# Patient Record
Sex: Male | Born: 1950
Health system: Southern US, Community
[De-identification: ages and names within clinical notes are randomized; demographics above are authoritative.]

## PROBLEM LIST (undated history)

## (undated) DIAGNOSIS — C801 Malignant (primary) neoplasm, unspecified: Secondary | ICD-10-CM

## (undated) DIAGNOSIS — F329 Major depressive disorder, single episode, unspecified: Secondary | ICD-10-CM

## (undated) DIAGNOSIS — Z8546 Personal history of malignant neoplasm of prostate: Secondary | ICD-10-CM

## (undated) DIAGNOSIS — F419 Anxiety disorder, unspecified: Secondary | ICD-10-CM

## (undated) DIAGNOSIS — K219 Gastro-esophageal reflux disease without esophagitis: Secondary | ICD-10-CM

## (undated) DIAGNOSIS — F32A Depression, unspecified: Secondary | ICD-10-CM

## (undated) DIAGNOSIS — E079 Disorder of thyroid, unspecified: Secondary | ICD-10-CM

## (undated) DIAGNOSIS — K2289 Other specified disease of esophagus: Secondary | ICD-10-CM

## (undated) DIAGNOSIS — K648 Other hemorrhoids: Secondary | ICD-10-CM

## (undated) DIAGNOSIS — K222 Esophageal obstruction: Secondary | ICD-10-CM

## (undated) DIAGNOSIS — K228 Other specified diseases of esophagus: Secondary | ICD-10-CM

## (undated) DIAGNOSIS — J189 Pneumonia, unspecified organism: Secondary | ICD-10-CM

## (undated) HISTORY — DX: Malignant (primary) neoplasm, unspecified: C80.1

## (undated) HISTORY — DX: Other specified disease of esophagus: K22.89

## (undated) HISTORY — DX: Other hemorrhoids: K64.8

## (undated) HISTORY — DX: Gastro-esophageal reflux disease without esophagitis: K21.9

## (undated) HISTORY — DX: Esophageal obstruction: K22.2

## (undated) HISTORY — DX: Anxiety disorder, unspecified: F41.9

## (undated) HISTORY — DX: Personal history of malignant neoplasm of prostate: Z85.46

## (undated) HISTORY — DX: Pneumonia, unspecified organism: J18.9

## (undated) HISTORY — PX: TONSILLECTOMY: SUR1361

## (undated) HISTORY — DX: Disorder of thyroid, unspecified: E07.9

## (undated) HISTORY — DX: Other specified diseases of esophagus: K22.8

## (undated) HISTORY — DX: Depression, unspecified: F32.A

---
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
## 2005-01-06 HISTORY — PX: REFRACTIVE SURGERY: SHX103

## 2008-03-23 ENCOUNTER — Encounter (INDEPENDENT_AMBULATORY_CARE_PROVIDER_SITE_OTHER): Payer: Self-pay | Admitting: *Deleted

## 2008-04-06 HISTORY — PX: COLONOSCOPY: SHX174

## 2008-04-20 ENCOUNTER — Ambulatory Visit: Payer: Self-pay | Admitting: Internal Medicine

## 2008-04-21 ENCOUNTER — Ambulatory Visit: Payer: Self-pay | Admitting: Internal Medicine

## 2008-04-29 LAB — CONVERTED CEMR LAB
Albumin: 4 g/dL (ref 3.5–5.2)
Alkaline Phosphatase: 47 units/L (ref 39–117)
Basophils Absolute: 0 10*3/uL (ref 0.0–0.1)
Bilirubin, Direct: 0.2 mg/dL (ref 0.0–0.3)
Cholesterol: 188 mg/dL (ref 0–200)
Eosinophils Absolute: 0.1 10*3/uL (ref 0.0–0.7)
Eosinophils Relative: 2.6 % (ref 0.0–5.0)
GFR calc non Af Amer: 66.12 mL/min (ref >60)
HCT: 45.7 % (ref 39.0–52.0)
HDL: 50.5 mg/dL (ref >39.00)
MCHC: 34 g/dL (ref 30.0–36.0)
MCV: 92.8 fL (ref 78.0–100.0)
Monocytes Absolute: 0.7 10*3/uL (ref 0.1–1.0)
Monocytes Relative: 12.8 % — ABNORMAL HIGH (ref 3.0–12.0)
Neutro Abs: 3.1 10*3/uL (ref 1.4–7.7)
Neutrophils Relative %: 55.2 % (ref 43.0–77.0)
PSA: 11.94 ng/mL — ABNORMAL HIGH (ref 0.10–4.00)
Platelets: 176 10*3/uL (ref 150.0–400.0)
Potassium: 4.8 meq/L (ref 3.5–5.1)
Triglycerides: 68 mg/dL (ref 0.0–149.0)
VLDL: 13.6 mg/dL (ref 0.0–40.0)

## 2008-05-01 ENCOUNTER — Encounter (INDEPENDENT_AMBULATORY_CARE_PROVIDER_SITE_OTHER): Payer: Self-pay | Admitting: *Deleted

## 2008-05-02 ENCOUNTER — Telehealth (INDEPENDENT_AMBULATORY_CARE_PROVIDER_SITE_OTHER): Payer: Self-pay | Admitting: *Deleted

## 2008-05-10 ENCOUNTER — Ambulatory Visit: Payer: Self-pay | Admitting: Gastroenterology

## 2008-05-24 ENCOUNTER — Ambulatory Visit: Payer: Self-pay | Admitting: Gastroenterology

## 2008-05-25 ENCOUNTER — Ambulatory Visit: Payer: Self-pay | Admitting: Internal Medicine

## 2008-05-25 ENCOUNTER — Telehealth: Payer: Self-pay | Admitting: Internal Medicine

## 2008-05-25 LAB — CONVERTED CEMR LAB: PSA: 12.69 ng/mL — ABNORMAL HIGH (ref 0.10–4.00)

## 2008-05-26 ENCOUNTER — Encounter (INDEPENDENT_AMBULATORY_CARE_PROVIDER_SITE_OTHER): Payer: Self-pay | Admitting: *Deleted

## 2008-06-23 ENCOUNTER — Encounter: Payer: Self-pay | Admitting: Internal Medicine

## 2008-06-29 ENCOUNTER — Encounter: Payer: Self-pay | Admitting: Internal Medicine

## 2008-08-15 ENCOUNTER — Encounter: Payer: Self-pay | Admitting: Internal Medicine

## 2008-08-21 ENCOUNTER — Ambulatory Visit: Admission: RE | Admit: 2008-08-21 | Discharge: 2008-09-20 | Payer: Self-pay | Admitting: Radiation Oncology

## 2008-08-22 ENCOUNTER — Encounter: Payer: Self-pay | Admitting: Internal Medicine

## 2008-11-07 ENCOUNTER — Encounter: Payer: Self-pay | Admitting: Internal Medicine

## 2008-11-20 ENCOUNTER — Encounter (INDEPENDENT_AMBULATORY_CARE_PROVIDER_SITE_OTHER): Payer: Self-pay | Admitting: Urology

## 2008-11-20 ENCOUNTER — Inpatient Hospital Stay (HOSPITAL_COMMUNITY): Admission: RE | Admit: 2008-11-20 | Discharge: 2008-11-21 | Payer: Self-pay | Admitting: Urology

## 2008-11-20 DIAGNOSIS — Z8546 Personal history of malignant neoplasm of prostate: Secondary | ICD-10-CM

## 2008-11-20 HISTORY — PX: PROSTATECTOMY: SHX69

## 2008-11-20 HISTORY — DX: Personal history of malignant neoplasm of prostate: Z85.46

## 2008-11-27 ENCOUNTER — Encounter: Payer: Self-pay | Admitting: Internal Medicine

## 2009-01-06 DIAGNOSIS — J189 Pneumonia, unspecified organism: Secondary | ICD-10-CM

## 2009-01-06 HISTORY — DX: Pneumonia, unspecified organism: J18.9

## 2009-01-11 ENCOUNTER — Telehealth: Payer: Self-pay | Admitting: Internal Medicine

## 2009-01-11 ENCOUNTER — Ambulatory Visit: Payer: Self-pay | Admitting: Internal Medicine

## 2009-01-11 DIAGNOSIS — R091 Pleurisy: Secondary | ICD-10-CM | POA: Insufficient documentation

## 2009-01-11 DIAGNOSIS — R0602 Shortness of breath: Secondary | ICD-10-CM

## 2009-01-11 DIAGNOSIS — J189 Pneumonia, unspecified organism: Secondary | ICD-10-CM | POA: Insufficient documentation

## 2009-01-11 DIAGNOSIS — Z8546 Personal history of malignant neoplasm of prostate: Secondary | ICD-10-CM | POA: Insufficient documentation

## 2009-01-11 DIAGNOSIS — R0609 Other forms of dyspnea: Secondary | ICD-10-CM | POA: Insufficient documentation

## 2009-01-11 DIAGNOSIS — R0600 Dyspnea, unspecified: Secondary | ICD-10-CM | POA: Insufficient documentation

## 2009-01-12 ENCOUNTER — Telehealth: Payer: Self-pay | Admitting: Internal Medicine

## 2009-01-22 ENCOUNTER — Ambulatory Visit: Payer: Self-pay | Admitting: Internal Medicine

## 2009-01-22 DIAGNOSIS — F419 Anxiety disorder, unspecified: Secondary | ICD-10-CM | POA: Insufficient documentation

## 2009-01-22 LAB — CONVERTED CEMR LAB
Bilirubin Urine: NEGATIVE
Glucose, Urine, Semiquant: NEGATIVE
Ketones, urine, test strip: NEGATIVE
Protein, U semiquant: NEGATIVE
Urobilinogen, UA: 0.2
pH: 6

## 2009-01-23 ENCOUNTER — Telehealth (INDEPENDENT_AMBULATORY_CARE_PROVIDER_SITE_OTHER): Payer: Self-pay | Admitting: *Deleted

## 2009-01-23 ENCOUNTER — Encounter (INDEPENDENT_AMBULATORY_CARE_PROVIDER_SITE_OTHER): Payer: Self-pay | Admitting: *Deleted

## 2009-01-23 ENCOUNTER — Encounter: Payer: Self-pay | Admitting: Internal Medicine

## 2009-01-23 LAB — CONVERTED CEMR LAB
Basophils Absolute: 0.1 10*3/uL (ref 0.0–0.1)
Basophils Relative: 0.7 % (ref 0.0–3.0)
Eosinophils Absolute: 0.7 10*3/uL (ref 0.0–0.7)
Lymphocytes Relative: 17.8 % (ref 12.0–46.0)
MCHC: 32.4 g/dL (ref 30.0–36.0)
Monocytes Relative: 9.9 % (ref 3.0–12.0)
Neutrophils Relative %: 65.4 % (ref 43.0–77.0)
RBC: 4.56 M/uL (ref 4.22–5.81)

## 2009-01-24 ENCOUNTER — Encounter: Payer: Self-pay | Admitting: Internal Medicine

## 2009-01-30 ENCOUNTER — Ambulatory Visit: Payer: Self-pay | Admitting: Internal Medicine

## 2009-01-31 ENCOUNTER — Telehealth: Payer: Self-pay | Admitting: Internal Medicine

## 2009-01-31 DIAGNOSIS — J90 Pleural effusion, not elsewhere classified: Secondary | ICD-10-CM | POA: Insufficient documentation

## 2009-02-02 ENCOUNTER — Encounter: Payer: Self-pay | Admitting: Internal Medicine

## 2009-02-02 ENCOUNTER — Ambulatory Visit (HOSPITAL_COMMUNITY): Admission: RE | Admit: 2009-02-02 | Discharge: 2009-02-02 | Payer: Self-pay | Admitting: Internal Medicine

## 2009-02-09 ENCOUNTER — Encounter: Payer: Self-pay | Admitting: Internal Medicine

## 2009-02-16 ENCOUNTER — Telehealth (INDEPENDENT_AMBULATORY_CARE_PROVIDER_SITE_OTHER): Payer: Self-pay | Admitting: *Deleted

## 2009-02-20 ENCOUNTER — Ambulatory Visit: Payer: Self-pay | Admitting: Internal Medicine

## 2009-03-23 ENCOUNTER — Telehealth (INDEPENDENT_AMBULATORY_CARE_PROVIDER_SITE_OTHER): Payer: Self-pay | Admitting: *Deleted

## 2009-05-01 ENCOUNTER — Telehealth (INDEPENDENT_AMBULATORY_CARE_PROVIDER_SITE_OTHER): Payer: Self-pay | Admitting: *Deleted

## 2009-05-30 ENCOUNTER — Encounter: Payer: Self-pay | Admitting: Internal Medicine

## 2009-05-31 ENCOUNTER — Telehealth (INDEPENDENT_AMBULATORY_CARE_PROVIDER_SITE_OTHER): Payer: Self-pay | Admitting: *Deleted

## 2009-06-28 ENCOUNTER — Encounter: Payer: Self-pay | Admitting: Internal Medicine

## 2009-06-28 ENCOUNTER — Telehealth (INDEPENDENT_AMBULATORY_CARE_PROVIDER_SITE_OTHER): Payer: Self-pay | Admitting: *Deleted

## 2009-07-20 ENCOUNTER — Telehealth (INDEPENDENT_AMBULATORY_CARE_PROVIDER_SITE_OTHER): Payer: Self-pay | Admitting: *Deleted

## 2009-08-27 ENCOUNTER — Telehealth (INDEPENDENT_AMBULATORY_CARE_PROVIDER_SITE_OTHER): Payer: Self-pay | Admitting: *Deleted

## 2009-10-26 ENCOUNTER — Telehealth (INDEPENDENT_AMBULATORY_CARE_PROVIDER_SITE_OTHER): Payer: Self-pay | Admitting: *Deleted

## 2009-12-05 ENCOUNTER — Encounter: Payer: Self-pay | Admitting: Internal Medicine

## 2009-12-25 ENCOUNTER — Telehealth (INDEPENDENT_AMBULATORY_CARE_PROVIDER_SITE_OTHER): Payer: Self-pay | Admitting: *Deleted

## 2010-01-17 ENCOUNTER — Encounter: Payer: Self-pay | Admitting: Internal Medicine

## 2010-02-05 NOTE — Progress Notes (Signed)
Summary: Lab/Chest Xray Results  Phone Note Outgoing Call Call back at [facility] Phone [phone]   Call placed by: Shonna Chock,  January 23, 2009 12:11 PM Call placed to: Patient Summary of Call: Spoke with patient to discuss labs and chest xray: White blood count still  mildly increased; recommend Avelox 400 mg once daily #5  R lower lobe poorly inflated . Push Incentive Spirometry X4 hourly while awake . Repeat CXray in 1 week.  Patient ok'd all information. Samples of Avelox placed at the front along with labs and order to recheck chest xray in 1 week./Chrae [facility]  January 23, 2009 4:34 PM

## 2010-02-05 NOTE — Letter (Signed)
Summary: [facility]  [facility]   Imported By: Lanelle Bal 02/02/2009 08:12:58  _____________________________________________________________________  External Attachment:    Type:   Image     Comment:   External Document

## 2010-02-05 NOTE — Assessment & Plan Note (Signed)
Summary: PAIN W/BREATHING,? PNEUMONIA/RH.....   Vital Signs:  Patient profile:   60 year old male Height:      74 inches Weight:      198 pounds O2 Sat:      99 % on Room air Temp:     98.8 degrees F oral Pulse rate:   64 / minute BP sitting:   120 / 78  (left arm)  Vitals Entered By: Jeremy Johann CMA (January 11, 2009 12:42 PM)  O2 Flow:  Room air CC: difficulty breathing, sob x1day Comments REVIEWED MED LIST, PATIENT AGREED DOSE AND INSTRUCTION CORRECT    CC:  difficulty breathing and sob x1day.  History of Present Illness: Acute pain  RUQ , with pressure upper chest &  sharp pain up to a 7 @ the R scapula limiting inspiration as of 01/10/2009. RTI last  weeks ago as  "viral ST, sinus headache . No PMH of smoking , pulmonary disease or clotting disorder. ZO:XWRU, Advil with benefit . PMH of prostate CA , S/P Robotic surgery.  Allergies (verified): No Known Drug Allergies  Past History:  Past Medical History: Keratonus , Dr Ether Griffins, [facility] Prostate cancer, hx of, Dr Laverle Patter  Past Surgical History: Laser surgery for detached retina 2007 Tonsillectomy Prostatectomy, Robotic , Dr Laverle Patter, 11/20/2008  Review of Systems General:  Complains of sweats; denies chills and fever. ENT:  Denies earache, nasal congestion, and sinus pressure; Frontal headache ; no facial pain; no purulence. Resp:  Complains of cough, pleuritic, shortness of breath, and sputum productive; denies coughing up blood and wheezing; Scant clear phlegm.  Physical Exam  General:  well-nourished,in no acute distress; alert,appropriate and cooperative throughout examination Ears:  External ear exam shows no significant lesions or deformities.  Otoscopic examination reveals clear canals, tympanic membranes are intact bilaterally without bulging, retraction, inflammation or discharge. Hearing is grossly normal bilaterally. Nose:  External nasal examination shows no deformity or inflammation. Nasal mucosa are  pink and moist without lesions or exudates. Mouth:  Oral mucosa and oropharynx without lesions or exudates.  Teeth in good repair. Lungs:  Normal respiratory effort, chest expands symmetrically. Lungs : mild crackles  @ R base Heart:  Normal rate and regular rhythm. S1 and S2 normal without gallop, murmur, click, rub or other extra sounds. Extremities:  No clubbing, cyanosis, edema. Neg Homan's  Skin:  Intact without suspicious lesions or rashes Cervical Nodes:  No lymphadenopathy noted Axillary Nodes:  No palpable lymphadenopathy Psych:  memory intact for recent and remote, normally interactive, and good eye contact.     Impression & Recommendations:  Problem # 1:  PNEUMONIA, RIGHT LOWER LOBE (ICD-486)  The following medications were removed from the medication list:    Ciprofloxacin Hcl 500 Mg Tabs (Ciprofloxacin hcl) .[name]... 1 two times a day His updated medication list for this problem includes:    Cefuroxime Axetil 500 Mg Tabs (Cefuroxime axetil) .[name]... 1 two times a day    Azithromycin 250 Mg Tabs (Azithromycin) .[name]... As per pack  Problem # 2:  PLEURISY (ICD-511.0) Clinically PTE NOT present  Problem # 3:  PROSTATE CANCER, HX OF (ICD-V10.46)  Complete Medication List: 1)  Cefuroxime Axetil 500 Mg Tabs (Cefuroxime axetil) .[name].. 1 two times a day 2)  Azithromycin 250 Mg Tabs (Azithromycin) .... As per pack 3)  Tramadol Hcl 50 Mg Tabs (Tramadol hcl) .[name].. 1 q 4-6 hrs as needed  pain  Patient Instructions: 1)  Blow 10 balloons daily. 2)  Take 400-600mg  of Ibuprofen (  Advil, Motrin) with food every 4-6 hours as needed for relief of pain or comfort of fever. Report Warning Signs : increasing pain, fever, SOB, & hemoptysis. Prescriptions: TRAMADOL HCL 50 MG TABS (TRAMADOL HCL) 1 q 4-6 hrs as needed  pain  #30 x 1   Entered and Authorized by:   Marga Melnick MD   Signed by:   Marga Melnick MD on 01/11/2009   Method used:   Faxed to ...       [facility] Kitchen (retail)        [address]       [place], [place]  [address]       Ph: [phone]       Fax: [phone]   RxID:   [id number] AZITHROMYCIN 250 MG TABS (AZITHROMYCIN) as per pack  #1 x 0   Entered and Authorized by:   Marga Melnick MD   Signed by:   Marga Melnick MD on 01/11/2009   Method used:   Faxed to ...       [facility] Kitchen (retail)       [address]       [place], [place]  [address]       Ph: [phone]       Fax: [phone]   RxID:   [id number] CEFUROXIME AXETIL 500 MG TABS (CEFUROXIME AXETIL) 1 two times a day  #20 x 0   Entered and Authorized by:   Marga Melnick MD   Signed by:   Marga Melnick MD on 01/11/2009   Method used:   Faxed to ...       [facility] Kitchen (retail)       [address]       [place], [place]  [address]       Ph: [phone]       Fax: [phone]   RxID:   [id number]

## 2010-02-05 NOTE — Letter (Signed)
Summary: [facility]  [facility]   Imported By: Lanelle Bal 06/14/2009 13:28:12  _____________________________________________________________________  External Attachment:    Type:   Image     Comment:   External Document

## 2010-02-05 NOTE — Progress Notes (Signed)
Summary: Refill Request  Phone Note Refill Request Call back at Home Phone [phone] Message from:  Patient  Refills Requested: Medication #1:  LORAZEPAM 0.5 MG TABS 1 q 8 hrs as needed. [facility]   Method Requested: Fax to Local Pharmacy Initial call taken by: Shonna Chock,  February 16, 2009 5:09 PM    Prescriptions: LORAZEPAM 0.5 MG TABS (LORAZEPAM) 1 q 8 hrs as needed  #30 x 0   Entered by:   Shonna Chock   Authorized by:   Marga Melnick MD   Signed by:   Shonna Chock on 02/16/2009   Method used:   Printed then faxed to ...       [facility] Kitchen (retail)       [address]       [place], [place]  [address]       Ph: [phone]       Fax: [phone]   RxID:   [id number]

## 2010-02-05 NOTE — Progress Notes (Signed)
Summary: refill   Phone Note Refill Request Call back at [phone] Message from:  Pharmacy on October 26, 2009 9:17 AM  Refills Requested: Medication #1:  LORAZEPAM 0.5 MG TABS 1 q 8 hrs as needed   Dosage confirmed as above?Dosage Confirmed   Supply Requested: 1 month   Last Ref[facility]: [facility]  Next Appointment Scheduled: none Initial call taken by: Lavell Islam,  October 26, 2009 9:18 AM    Prescriptions: LORAZEPAM 0.5 MG TABS (LORAZEPAM) 1 q 8 hrs as needed  #30 x 1   Entered by:   Shonna Chock CMA   Authorized by:   Marga Melnick MD   Signed by:   Shonna Chock CMA on 10/26/2009   Method used:   Printed then faxed to ...       [facility]land Kitchen (retail)       [address]       [place], [place]  [address]       Ph: [phone]       Fax: [phone]   RxID:   [id number]

## 2010-02-05 NOTE — Letter (Signed)
Summary: Results Follow up Letter  [facility] [place]  [address] [place], [place] [address]   Phone: [phone]  Fax: [phone]    01/23/2009 MRN: [id number]  [facility] [address] [place], [place]  [address]  Dear Caleb Jones,  The following are the results of your recent test(s):  Test         Result    Pap Smear:        Normal _____  Not Normal _____ Comments: ______________________________________________________ Cholesterol: LDL(Bad cholesterol):         Your goal is less than:         HDL (Good cholesterol):       Your goal is more than: Comments:  ______________________________________________________ Mammogram:        Normal _____  Not Normal _____ Comments:  ___________________________________________________________________ Hemoccult:        Normal _____  Not normal _______ Comments:    _____________________________________________________________________ Other Tests: Please see attached labs and chest xray done on 01/22/2009    We routinely do not discuss normal results over the telephone.  If you desire a copy of the results, or you have any questions about this information we can discuss them at your next office visit.   Sincerely,

## 2010-02-05 NOTE — Progress Notes (Signed)
Summary: Caleb Jones  Phone Note Call from Patient Call back at [phone]   Caller: Patient Call For: Marga Melnick MD Reason for Call: Talk to Nurse Complaint: Breathing Problems Summary of Call: PATIENT CALLING, C/O OF TROUBLE TAKING DEEP BREATHS ASSOCIATED WITH PAIN IN UPPER BACK.  PATIENT HAS AN APPT TO SEE DR. HOPPER TODAY, 12:15PM.   Initial call taken by: Magdalen Spatz [facility],  January 11, 2009 8:34 AM  Follow-up for Phone Call        pt c/o difficulty taking deep breath, sharpe pain right upper back, fatigue, sinus headache, and sore throat. pt denies any fever, cough, chest pain,numbness or tingling and dizziness. pt has pending OV advise UC if changes............Marland KitchenFelecia Deloach CMA  January 11, 2009 8:56 AM    Additional Follow-up for Phone Call Additional follow up Details #1::        Cxray @ [facility] pre appt @ 12:51 (511.0) Additional Follow-up by: Marga Melnick MD,  January 11, 2009 10:15 AM  New Problems: DYSPNEA [phone])   Additional Follow-up for Phone Call Additional follow up Details #2::    pt aware..............Marland KitchenFelecia Deloach CMA  January 11, 2009 10:30 AM   New Problems: DYSPNEA (ICD-786.05)

## 2010-02-05 NOTE — Progress Notes (Signed)
Summary: refill  Phone Note Refill Request Message from:  Fax from Pharmacy on May 31, 2009 9:59 AM  Refills Requested: Medication #1:  LORAZEPAM 0.5 MG TABS 1 q 8 hrs as [facility] fax [phone]  ph same   Method Requested: Fax to Local Pharmacy Initial call taken by: Okey Regal [place],  May 31, 2009 10:04 AM    Prescriptions: LORAZEPAM 0.5 MG TABS (LORAZEPAM) 1 q 8 hrs as needed  #30 x 0   Entered by:   Shonna Chock   Authorized by:   Marga Melnick MD   Signed by:   Shonna Chock on 05/31/2009   Method used:   Printed then faxed to ...       [facility] Kitchen (retail)       [address]       [place], [place]  [address]       Ph: [phone]       Fax: [phone]   RxID:   [id number]

## 2010-02-05 NOTE — Progress Notes (Signed)
Summary: refill  Phone Note Refill Request Message from:  Fax from Pharmacy on July 20, 2009 3:14 PM  Refills Requested: Medication #1:  LORAZEPAM 0.5 MG TABS 1 q 8 hrs as needed [facility] - fax [phone] --- phone [phone]  Initial call taken by: Okey Regal [place],  July 20, 2009 3:15 PM  Follow-up for Phone Call        OK X 1 Follow-up by: Marga Melnick MD,  July 20, 2009 4:04 PM    Prescriptions: LORAZEPAM 0.5 MG TABS (LORAZEPAM) 1 q 8 hrs as needed  #30 x 0   Entered by:   Shonna Chock CMA   Authorized by:   Marga Melnick MD   Signed by:   Shonna Chock CMA on 07/20/2009   Method used:   Printed then faxed to ...       [facility] Kitchen (retail)       [address]       [place], [place]  [address]       Ph: [phone]       Fax: [phone]   RxID:   [id number]

## 2010-02-05 NOTE — Progress Notes (Signed)
Summary: Xray results  Phone Note Outgoing Call Call back at [facility] Phone [phone] Call back at [phone]   Call placed by: Shonna Chock,  January 31, 2009 4:55 PM Call placed to: Patient Summary of Call: Dr.Aashish Hamm spoke with patient  Pleural effusion persists . Xray guided thoracentesis ( drawing off of fluid) needed for studies. ( Gram stain, cell count, LDH, protein, glucose,routine C&S, AFB smear & culture, Fungal smear & culture, Cytology)  Hopp please place order   Chrae [facility]  January 31, 2009 4:56 PM     Follow-up for Phone Call        DR. Taylah Dubiel, RADIOLOGY NEEDS TO KNOW HOW MUCH FLUID YOU WANT DRAWN OFF PLEASE.  CHRAE, PLEASE RETURN PT'S CALL ON HIS CELL, [phone]. Magdalen Spatz [facility]  February 01, 2009 8:37 AM   Additional Follow-up for Phone Call Additional follow up Details #1::        PATIENT CALLING FOR 2ND TIME TODAY, ANXIOUS TO GET PROCEDURE SCHEDULED.  DR. Alyscia Carmon, WILL PT NEED ALBUMIN TREATMENT, AND HOW MUCH FLUID DO YOU WANT DRAWN?  Magdalen Spatz [facility]  February 01, 2009 4:34 PM   New Problems: PLEURAL EFFUSION (ICD-511.9)   Additional Follow-up for Phone Call Additional follow up Details #2::    Per Dr. Alwyn Ren, pt will not need Albumin treatment, and draw up to 1 liter of fluid.  Patient is scheduled at [facility] on 02-02-2009 at 2pm.  Patient wanted this done asap, and [facility] only does 2x per week, and are closed on Fridays.  I spoke with patient he is aware of all above. Follow-up by: Magdalen Spatz [facility],  February 01, 2009 5:06 PM  New Problems: PLEURAL EFFUSION (ICD-511.9)

## 2010-02-05 NOTE — Miscellaneous (Signed)
Summary: Orders Update  Clinical Lists Changes  Orders: Added new Test order of T-2 View CXR (71020TC) - Signed 

## 2010-02-05 NOTE — Progress Notes (Signed)
Summary: Refill Request  Phone Note Refill Request Call back at [phone] Message from:  Pharmacy on August 27, 2009 1:44 PM  Refills Requested: Medication #1:  LORAZEPAM 0.5 MG TABS 1 q 8 hrs as needed   Dosage confirmed as above?Dosage Confirmed   Supply Requested: 1 month   Last Ref[facility]: [facility] [address]   Next Appointment Scheduled: none Initial call taken by: Harold Barban,  August 27, 2009 1:44 PM    Prescriptions: LORAZEPAM 0.5 MG TABS (LORAZEPAM) 1 q 8 hrs as needed  #30 x 1   Entered by:   Shonna Chock CMA   Authorized by:   Marga Melnick MD   Signed by:   Shonna Chock CMA on 08/27/2009   Method used:   Printed then faxed to ...       [facility] Kitchen (retail)       [address]       [place], [place]  [address]       Ph: [phone]       Fax: [phone]   RxID:   [id number]

## 2010-02-05 NOTE — Progress Notes (Signed)
Summary: Refill Request/? interactions   Phone Note Refill Request Message from:  Patient  Refills Requested: Medication #1:  LORAZEPAM 0.5 MG TABS 1 q 8 hrs as needed. Patient is taking an OTC med for allergies and Recently Rx'ed Cialis by another Doctor and would like to know if this will interfer with Lorazepam.   Method Requested: Fax to Local Pharmacy Initial call taken by: Shonna Chock,  March 23, 2009 2:49 PM  Follow-up for Phone Call        Per Dr.Hopper Lorazepam will not interfer with OTC allergy meds and the safest to take is Claritin (Loratadine) or with Cialis.   Left message on VM informing patient of Dr.Hopper's instructions and RX [facility] to [facility] [address]. Follow-up by: Shonna Chock,  March 23, 2009 4:28 PM    New/Updated Medications: CIALIS 20 MG TABS (TADALAFIL) as needed Prescriptions: LORAZEPAM 0.5 MG TABS (LORAZEPAM) 1 q 8 hrs as needed  #30 x 0   Entered by:   Shonna Chock   Authorized by:   Marga Melnick MD   Signed by:   Shonna Chock on 03/23/2009   Method used:   Printed then faxed to ...       [facility] Kitchen (retail)       [address]       [place], [place]  [address]       Ph: [phone]       Fax: [phone]   RxID:   [id number]

## 2010-02-05 NOTE — Letter (Signed)
Summary: [facility]   Imported By: Lanelle Bal 07/06/2009 15:15:10  _____________________________________________________________________  External Attachment:    Type:   Image     Comment:   External Document

## 2010-02-05 NOTE — Progress Notes (Signed)
Summary: REFILL REQUEST  Phone Note Refill Request Call back at [phone] Message from:  Pharmacy on June 28, 2009 9:10 AM  Refills Requested: Medication #1:  LORAZEPAM 0.5 MG TABS 1 q 8 hrs as needed   Dosage confirmed as above?Dosage Confirmed   Supply Requested: 1 month   Last Ref[facility]: [facility]/31/[facility] [address]  Next Appointment Scheduled: NONE Initial call taken by: Lavell Islam,  June 28, 2009 9:10 AM    Prescriptions: LORAZEPAM 0.5 MG TABS (LORAZEPAM) 1 q 8 hrs as needed  #30 x 0   Entered by:   Shonna Chock   Authorized by:   Marga Melnick MD   Signed by:   Shonna Chock on 06/28/2009   Method used:   Printed then faxed to ...       [facility] Kitchen (retail)       [address]       [place], [place]  [address]       Ph: [phone]       Fax: [phone]   RxID:   [id number]

## 2010-02-05 NOTE — Progress Notes (Signed)
Summary: ? med  Phone Note Call from Patient Call back at [facility] Phone [phone]   Refills Requested: Medication #1:  TRAMADOL HCL 50 MG TABS 1 q 4-6 hrs as needed  pain. Caller: Patient Summary of Call: pt was rx TRAMADOL and wants to know if it is ok to take it along with advil at the same time................Marland KitchenFelecia Deloach CMA  January 12, 2009 5:26 PM   Follow-up for Phone Call        OK within 2 hrs of each other Follow-up by: Marga Melnick MD,  January 12, 2009 5:39 PM  Additional Follow-up for Phone Call Additional follow up Details #1::        pt aware...............sign

## 2010-02-05 NOTE — Progress Notes (Signed)
Summary: Refill  Phone Note Refill Request Call back at [phone] Patients Cell Number Message from:  Patient on May 01, 2009 12:47 PM  Refills Requested: Medication #1:  LORAZEPAM 0.5 MG TABS 1 q 8 hrs as needed  Method Requested: Telephone to Pharmacy Next Appointment Scheduled: No Future Appointments Initial call taken by: Barnie Mort,  May 01, 2009 12:47 PM  Follow-up for Phone Call        rx faxed.Marland KitchenMarland KitchenMarland KitchenFelecia Deloach CMA  May 01, 2009 2:49 PM     Prescriptions: LORAZEPAM 0.5 MG TABS (LORAZEPAM) 1 q 8 hrs as needed  #30 x 0   Entered by:   Shonna Chock   Authorized by:   Marga Melnick MD   Signed by:   Shonna Chock on 05/01/2009   Method used:   Printed then faxed to ...       [facility] Kitchen (retail)       [address]       [place], [place]  [address]       Ph: [phone]       Fax: [phone]   RxID:   [id number]

## 2010-02-05 NOTE — Assessment & Plan Note (Signed)
Summary: pneumonia/not feeling better/kdc   Vital Signs:  Patient profile:   60 year old male Height:      74 inches Weight:      202 pounds BMI:     26.03 O2 Sat:      98 % on Room air Temp:     98.9 degrees F oral Pulse rate:   58 / minute BP sitting:   118 / 72  (left arm)  Vitals Entered By: Jeremy Johann CMA (January 22, 2009 1:01 PM)  O2 Flow:  Room air CC: trouble catching breath, lower back pain, unable to sleep Comments REVIEWED MED LIST, PATIENT AGREED DOSE AND INSTRUCTION CORRECT    CC:  trouble catching breath, lower back pain, and unable to sleep.  History of Present Illness: He is improved but he has been anxious about returning to work. He can't lie supine or in RLDP due to "pressure" R posterior chest. Tramadol of ? benefit.He could not blow up balloons , but simply blew up clear vegetable bag from grocery. He expresses concern about communicability of this infection & possibility of "undiagnosed cancer" & requested CT scan. He understands his Urologist stated "90% cure" for his prostate cancer but is still anxious about his prognosis.His fiancee, Casandra accompanied him & had list of questions.  Allergies (verified): No Known Drug Allergies  Review of Systems General:  Complains of sweats; denies chills and fever. CV:  Denies bluish discoloration of lips or nails, difficulty breathing at night, difficulty breathing while lying down, shortness of breath with exertion, swelling of feet, and swelling of hands. Resp:  Denies chest pain with inspiration, cough, coughing up blood, shortness of breath, sputum productive, and wheezing. MS:  See HPI; Complains of low back pain and thoracic pain. Psych:  Complains of anxiety.  Physical Exam  General:  well-nourished,in no acute distress; alert,appropriate and cooperative throughout examination Lungs:  Normal respiratory effort, chest expands symmetrically. Lungs are clear to auscultation, no crackles or wheezes. No  splinting or rub present Heart:  Normal rate and regular rhythm. S1 and S2 normal without gallop, murmur, click, rub or other extra sounds.No NVD or HJR Abdomen:  Bowel sounds positive,abdomen soft and non-tender without masses, organomegaly or hernias noted. Msk:  Lay down & sat up w/o help Extremities:  No clubbing, cyanosis, edema. Neurologic:  alert & oriented X3.   Skin:  Intact without suspicious lesions or rashes Cervical Nodes:  No lymphadenopathy noted Axillary Nodes:  No palpable lymphadenopathy Psych:  memory intact for recent and remote and slightly anxious.     Impression & Recommendations:  Problem # 1:  PNEUMONIA, RIGHT LOWER LOBE (ICD-486)  His updated medication list for this problem includes:    Cefuroxime Axetil 500 Mg Tabs (Cefuroxime axetil) .[name]... 1 two times a day    Azithromycin 250 Mg Tabs (Azithromycin) .[name]... As per pack  Orders: Venipuncture (62694) TLB-CBC Platelet - w/Differential (85025-CBCD) T-2 View CXR (71020TC)  Problem # 2:  PLEURISY (ICD-511.0) ? pleurisy variant; R/O persistant ATX Orders: Venipuncture (85462) TLB-CBC Platelet - w/Differential (85025-CBCD) T-2 View CXR (71020TC)  Problem # 3:  PROSTATE CANCER, HX OF (ICD-V10.46)  Problem # 4:  ANXIETY STATE, UNSPECIFIED (ICD-300.00) anxiety re prognosis & possibility of active malignancy. Trail of as needed Lorazepam will be conducted  Complete Medication List: 1)  Cefuroxime Axetil 500 Mg Tabs (Cefuroxime axetil) .[name].. 1 two times a day 2)  Azithromycin 250 Mg Tabs (Azithromycin) .... As per pack 3)  Tramadol Hcl 50 Mg  Tabs (Tramadol hcl) .[name].. 1 q 4-6 hrs as needed  pain 4)  Tylenol With Codeine #4 300-60 Mg Tabs (Acetaminophen-codeine) .[name].. 1-2 q 6 hrs as needed  Other Orders: Pneumococcal Vaccine (16109) Admin 1st Vaccine (60454) UA Dipstick w/o Micro (manual) (81002) T-Culture, Urine (09811-91478)  Patient Instructions: 1)  Incentive Spirometry two times a day - three times a  day . Lorazepam 0.5 mg every 8 hrs as needed for anxiety. Prescriptions: TYLENOL WITH CODEINE #4 300-60 MG TABS (ACETAMINOPHEN-CODEINE) 1-2 q 6 hrs as needed  #30 x 0   Entered and Authorized by:   Marga Melnick MD   Signed by:   Shonna Chock on 01/22/2009   Method used:   Print then Give to Patient   RxID:   [id number]    Immunizations Administered:  Pneumonia Vaccine:    Vaccine Type: Pneumovax    Site: left deltoid    Mfr: Merck    Dose: 0.5 ml    Route: IM    Given by: Shonna Chock    Exp. Date: 02/01/2010    Lot #: [id number]   Laboratory Results   Urine Tests    Routine Urinalysis   Color: lt. yellow Appearance: Clear Glucose: negative   (Normal Range: Negative) Bilirubin: negative   (Normal Range: Negative) Ketone: negative   (Normal Range: Negative) Spec. Gravity: <1.005   (Normal Range: 1.003-1.035) Blood: moderate   (Normal Range: Negative) pH: 6.0   (Normal Range: 5.0-8.0) Protein: negative   (Normal Range: Negative) Urobilinogen: 0.2   (Normal Range: 0-1) Nitrite: negative   (Normal Range: Negative) Leukocyte Esterace: negative   (Normal Range: Negative)

## 2010-02-07 NOTE — Progress Notes (Signed)
Summary: refill  Phone Note Refill Request Message from:  Fax from Pharmacy on December 25, 2009 2:27 PM  Refills Requested: Medication #1:  LORAZEPAM 0.5 MG TABS 1 q 8 hrs as needed [facility] [place] - fax [phone]  Initial call taken by: Okey Regal Spring,  December 25, 2009 2:27 PM    Prescriptions: LORAZEPAM 0.5 MG TABS (LORAZEPAM) 1 q 8 hrs as needed  #30 x 0   Entered by:   Shonna Chock CMA   Authorized by:   Marga Melnick MD   Signed by:   Shonna Chock CMA on 12/25/2009   Method used:   Printed then faxed to ...       [facility] (retail)       [address]       [place], [place]  [address]       Ph: [phone]       Fax: [phone]   RxID:   [id number]

## 2010-02-07 NOTE — Letter (Signed)
Summary: [facility]   Imported By: Lanelle Bal 12/17/2009 10:30:51  _____________________________________________________________________  External Attachment:    Type:   Image     Comment:   External Document

## 2010-02-08 ENCOUNTER — Telehealth (INDEPENDENT_AMBULATORY_CARE_PROVIDER_SITE_OTHER): Payer: Self-pay | Admitting: *Deleted

## 2010-02-13 NOTE — Progress Notes (Signed)
Summary: refill new rx  Phone Note Refill Request Message from:  Patient  Refills Requested: Medication #1:  LORAZEPAM 0.5 MG TABS 1 q 8 hrs as needed patient has new mail order ins - needs new rx for 30 day supply  ----he will pick up [id number]  Initial call taken by: Okey Regal Spring,  February 08, 2010 2:59 PM  Follow-up for Phone Call        Please advise of qty and refills. Lucious Groves CMA  February 08, 2010 4:14 PM   Additional Follow-up for Phone Call Additional follow up Details #1::        Spoke with male and informed her rx avalible for patient for pick-up on Monday  Additional Follow-up by: Shonna Chock CMA,  February 08, 2010 5:06 PM    Prescriptions: LORAZEPAM 0.5 MG TABS (LORAZEPAM) 1 q 8 hrs as needed  #30 x 0   Entered by:   Shonna Chock CMA   Authorized by:   Marga Melnick MD   Signed by:   Shonna Chock CMA on 02/08/2010   Method used:   Print then Give to Patient   RxID:   [id number]

## 2010-02-13 NOTE — Letter (Signed)
Summary: [facility]   Imported By: Lanelle Bal 02/04/2010 08:43:53  _____________________________________________________________________  External Attachment:    Type:   Image     Comment:   External Document

## 2010-03-24 LAB — FUNGUS CULTURE W SMEAR

## 2010-03-24 LAB — PROTEIN, BODY FLUID: Total protein, fluid: 4.9 g/dL

## 2010-03-24 LAB — BODY FLUID CELL COUNT WITH DIFFERENTIAL
Lymphs, Fluid: 17 %
Neutrophil Count, Fluid: 7 % (ref 0–25)

## 2010-03-24 LAB — LACTATE DEHYDROGENASE, PLEURAL OR PERITONEAL FLUID: LD, Fluid: 695 U/L — ABNORMAL HIGH (ref 3–23)

## 2010-03-24 LAB — AFB CULTURE WITH SMEAR (NOT AT ARMC): Acid Fast Smear: NONE SEEN

## 2010-03-24 LAB — BODY FLUID CULTURE

## 2010-03-24 LAB — PATHOLOGIST SMEAR REVIEW

## 2010-03-24 LAB — GLUCOSE, SEROUS FLUID: Glucose, Fluid: 94 mg/dL

## 2010-04-10 LAB — BASIC METABOLIC PANEL
BUN: 11 mg/dL (ref 6–23)
CO2: 30 mEq/L (ref 19–32)
Calcium: 9.5 mg/dL (ref 8.4–10.5)
GFR calc non Af Amer: >60 mL/min (ref >60)
Glucose, Bld: 77 mg/dL (ref 70–99)

## 2010-04-10 LAB — HEMOGLOBIN AND HEMATOCRIT, BLOOD
HCT: 36 % — ABNORMAL LOW (ref 39.0–52.0)
HCT: 41.6 % (ref 39.0–52.0)
Hemoglobin: 14.3 g/dL (ref 13.0–17.0)

## 2010-04-10 LAB — CBC
MCV: 93.6 fL (ref 78.0–100.0)
RBC: 4.78 MIL/uL (ref 4.22–5.81)
WBC: 7.7 10*3/uL (ref 4.0–10.5)

## 2010-04-10 LAB — TYPE AND SCREEN
ABO/RH(D): A POS
Antibody Screen: NEGATIVE

## 2010-04-10 LAB — ABO/RH: ABO/RH(D): A POS

## 2010-04-18 ENCOUNTER — Telehealth: Payer: Self-pay | Admitting: Internal Medicine

## 2010-04-18 MED ORDER — LORAZEPAM 0.5 MG PO TABS: 0.5000 mg | ORAL_TABLET | Freq: Three times a day (TID) | ORAL | Status: DC

## 2010-04-18 NOTE — Telephone Encounter (Signed)
Patient needs refill for [facility]azpam - [facility] [address]

## 2010-05-21 ENCOUNTER — Telehealth: Payer: Self-pay

## 2010-05-21 MED ORDER — LORAZEPAM 0.5 MG PO TABS: 0.5000 mg | ORAL_TABLET | Freq: Three times a day (TID) | ORAL | Status: DC

## 2010-05-21 NOTE — Telephone Encounter (Signed)
Please call patient to schedule a CPX, last OV was 01/22/2009. An appointment is necessary to continue refilling medications.

## 2010-05-22 MED ORDER — LORAZEPAM 0.5 MG PO TABS: 0.5000 mg | ORAL_TABLET | Freq: Three times a day (TID) | ORAL | Status: DC

## 2010-05-22 NOTE — Telephone Encounter (Signed)
RX approved for #30 with 1 refill, placed on ledge for sing and then to be faxed

## 2010-05-22 NOTE — Telephone Encounter (Signed)
Patient has lab appt on 7/30 and Dr Alwyn Ren CPX appt on 08/12/2010---he asks since he has made appt, that prescription for Lorazepam, can be "released" and now be [facility] [address], [address]  He called it in week ago and assumed that it was being held in order to make appt

## 2010-07-17 ENCOUNTER — Other Ambulatory Visit: Payer: Self-pay | Admitting: Internal Medicine

## 2010-07-17 MED ORDER — LORAZEPAM 0.5 MG PO TABS: 0.5000 mg | ORAL_TABLET | Freq: Three times a day (TID) | ORAL | Status: DC

## 2010-07-17 NOTE — Telephone Encounter (Signed)
Pending appointment 08/12/10, rx sent to pharmacy

## 2010-08-02 ENCOUNTER — Other Ambulatory Visit: Payer: Self-pay | Admitting: Internal Medicine

## 2010-08-02 DIAGNOSIS — Z0000 Encounter for general adult medical examination without abnormal findings: Secondary | ICD-10-CM

## 2010-08-05 ENCOUNTER — Other Ambulatory Visit: Payer: Self-pay

## 2010-08-05 ENCOUNTER — Ambulatory Visit: Payer: Self-pay

## 2010-08-05 DIAGNOSIS — Z0000 Encounter for general adult medical examination without abnormal findings: Secondary | ICD-10-CM

## 2010-08-05 DIAGNOSIS — Z0289 Encounter for other administrative examinations: Secondary | ICD-10-CM

## 2010-08-05 LAB — URINALYSIS
Bilirubin Urine: NEGATIVE
Hgb urine dipstick: NEGATIVE
Ketones, ur: NEGATIVE
Total Protein, Urine: NEGATIVE
Urine Glucose: NEGATIVE

## 2010-08-05 LAB — CBC WITH DIFFERENTIAL/PLATELET
Basophils Relative: 0.5 % (ref 0.0–3.0)
Eosinophils Relative: 1.8 % (ref 0.0–5.0)
HCT: 45 % (ref 39.0–52.0)
Lymphs Abs: 1.7 10*3/uL (ref 0.7–4.0)
MCV: 94.6 fl (ref 78.0–100.0)
Monocytes Absolute: 0.9 10*3/uL (ref 0.1–1.0)
Neutro Abs: 4 10*3/uL (ref 1.4–7.7)
Platelets: 177 10*3/uL (ref 150.0–400.0)
WBC: 6.9 10*3/uL (ref 4.5–10.5)

## 2010-08-05 LAB — TSH: TSH: 4.49 u[IU]/mL (ref 0.35–5.50)

## 2010-08-05 LAB — LIPID PANEL
HDL: 54.1 mg/dL (ref >39.00)
Total CHOL/HDL Ratio: 4

## 2010-08-05 LAB — BASIC METABOLIC PANEL
BUN: 12 mg/dL (ref 6–23)
CO2: 29 mEq/L (ref 19–32)
Calcium: 8.8 mg/dL (ref 8.4–10.5)
Creatinine, Ser: 1.1 mg/dL (ref 0.4–1.5)
Glucose, Bld: 85 mg/dL (ref 70–99)

## 2010-08-05 LAB — HEPATIC FUNCTION PANEL
AST: 21 U/L (ref 0–37)
Total Bilirubin: 0.9 mg/dL (ref 0.3–1.2)

## 2010-08-05 NOTE — Progress Notes (Signed)
Labs only

## 2010-08-12 ENCOUNTER — Ambulatory Visit (INDEPENDENT_AMBULATORY_CARE_PROVIDER_SITE_OTHER): Payer: BC Managed Care – PPO | Admitting: Internal Medicine

## 2010-08-12 ENCOUNTER — Encounter: Payer: Self-pay | Admitting: Internal Medicine

## 2010-08-12 VITALS — BP 110/74 | HR 60 | Temp 99.1°F | Ht 74.75 in | Wt 193.8 lb

## 2010-08-12 DIAGNOSIS — Z2911 Encounter for prophylactic immunotherapy for respiratory syncytial virus (RSV): Secondary | ICD-10-CM

## 2010-08-12 DIAGNOSIS — Z0000 Encounter for general adult medical examination without abnormal findings: Secondary | ICD-10-CM

## 2010-08-12 DIAGNOSIS — Z8546 Personal history of malignant neoplasm of prostate: Secondary | ICD-10-CM

## 2010-08-12 DIAGNOSIS — Z23 Encounter for immunization: Secondary | ICD-10-CM

## 2010-08-12 MED ORDER — LORAZEPAM 0.5 MG PO TABS: 0.5000 mg | ORAL_TABLET | Freq: Three times a day (TID) | ORAL | Status: DC

## 2010-08-12 NOTE — Progress Notes (Signed)
Addended by: Edgardo Roys on: 08/12/2010 03:10 PM   Modules accepted: Orders

## 2010-08-12 NOTE — Progress Notes (Signed)
Subjective:    Patient ID: Caleb Jones, male    DOB: Feb 05, 1950, 60 y.o.   MRN: [id number]  HPI  Caleb Jones  is here for a physical; he has no acute issues.     Review of Systems Patient reports no  vision/ hearing changes,anorexia, weight change, fever ,adenopathy, persistant / recurrent hoarseness, swallowing issues, chest pain,palpitations, edema,persistant / recurrent cough, hemoptysis, dyspnea(rest, exertional, paroxysmal nocturnal), gastrointestinal  bleeding (melena, rectal bleeding), abdominal pain, excessive heart burn, GU symptoms( dysuria, hematuria, pyuria, voiding/incontinence  issues) syncope, focal weakness, memory loss,numbness & tingling, skin/hair changes,depression, abnormal bruising/bleeding, or  musculoskeletal symptoms/signs.  Anxiety responsive to Lorazepam @ bedtime. Nails brittle. Walking 3 mpd 5-7 days     Objective:   Physical Exam Gen.: thin but healthy and well-nourished in appearance. Alert, appropriate and cooperative throughout exam. Head: Normocephalic without obvious abnormalities;  no alopecia  Eyes: No corneal or conjunctival inflammation noted. Pupils equal round reactive to light and accommodation. Fundal exam is benign without hemorrhages, exudate, papilledema. Extraocular motion intact. Vision grossly normal. Ears: External  ear exam reveals no significant lesions or deformities. Canals clear .TMs normal. Hearing is grossly normal bilaterally. Nose: External nasal exam reveals no deformity or inflammation. Nasal mucosa are pink and moist. No lesions or exudates noted. Septum normal Mouth: Oral mucosa and oropharynx reveal no lesions or exudates. Teeth in good repair. Neck: No deformities, masses, or tenderness noted. Range of motion &  Thyroid  normal. Lungs: Normal respiratory effort; chest expands symmetrically. Lungs are clear to auscultation without rales, wheezes, or increased work of breathing. Heart: Slow  rate and regular  rhythm. Normal S1 and  S2. No gallop, click, or rub. No  murmur. Abdomen: Bowel sounds normal; abdomen soft and nontender. No masses, organomegaly or hernias noted. Genitalia: as per Caleb Jones   .                                                                                   Musculoskeletal/extremities: No deformity or scoliosis noted of  the thoracic or lumbar spine. No clubbing, cyanosis, edema, or deformity noted. Range of motion  normal .Tone & strength  normal.Joints normal. Nail health  Good but slightly irregular rim. Vascular: Carotid, radial artery, dorsalis pedis and  posterior tibial pulses are full and equal. No bruits present. Neurologic: Alert and oriented x3. Deep tendon reflexes symmetrical and normal.          Skin: Intact without suspicious lesions or rashes. Lymph: No cervical, axillary  lymphadenopathy present. Psych: Mood and affect are normal. Normally interactive                                                                                         Assessment & Plan:  #1 comprehensive physical exam; no acute findings #2 see Problem List with Assessments &  Recommendations Plan: see Orders

## 2010-08-12 NOTE — Patient Instructions (Signed)
Please review Dr Gildardo Griffes book Eat, Drink & Be Healthy for dietary cholesterol information.  Verify Health Care Power of Attorney & Living Will are in place ; these place you in charge of your health care decisions.

## 2011-03-27 ENCOUNTER — Other Ambulatory Visit: Payer: Self-pay | Admitting: Internal Medicine

## 2011-03-27 MED ORDER — LORAZEPAM 0.5 MG PO TABS
0.5000 mg | ORAL_TABLET | Freq: Three times a day (TID) | ORAL | Status: DC
Start: 2011-03-27 — End: 2011-09-26

## 2011-03-27 NOTE — Telephone Encounter (Signed)
Refill for  Lorazepam 0.5MG  Tablet  Qty 60 Last written 8.6.12  Note from last refill: Order Comments:   MUST keep pending appointment  Dispense Quantity: 60  Came in 8.6.12 for CPX no visit since

## 2011-03-27 NOTE — Telephone Encounter (Signed)
RX sent

## 2011-09-25 ENCOUNTER — Other Ambulatory Visit: Payer: Self-pay

## 2011-09-25 NOTE — Telephone Encounter (Signed)
Refill for Larazepam 0.5mg  tablet; one tablet by mouth every 8 hours.  Last refill 08-09-11.

## 2011-09-25 NOTE — Telephone Encounter (Signed)
I left message on Voicemail informing patient he is due for an annual physical exam. Patient to call and schedule

## 2011-09-26 MED ORDER — LORAZEPAM 0.5 MG PO TABS: 0.5000 mg | ORAL_TABLET | Freq: Three times a day (TID) | ORAL | Status: DC

## 2011-09-26 NOTE — Telephone Encounter (Signed)
RX called in .

## 2011-09-26 NOTE — Addendum Note (Signed)
Addended by: Maurice Small on: 09/26/2011 10:33 AM   Modules accepted: Orders

## 2011-09-26 NOTE — Telephone Encounter (Signed)
Pt scheduled CPE for 11/21/11. He would like his medication filled until that time.

## 2011-11-04 ENCOUNTER — Ambulatory Visit (INDEPENDENT_AMBULATORY_CARE_PROVIDER_SITE_OTHER): Payer: BC Managed Care – PPO

## 2011-11-04 DIAGNOSIS — Z23 Encounter for immunization: Secondary | ICD-10-CM

## 2011-11-21 ENCOUNTER — Encounter: Payer: BC Managed Care – PPO | Admitting: Internal Medicine

## 2011-12-08 ENCOUNTER — Encounter: Payer: Self-pay | Admitting: Internal Medicine

## 2011-12-08 ENCOUNTER — Ambulatory Visit (INDEPENDENT_AMBULATORY_CARE_PROVIDER_SITE_OTHER): Payer: BC Managed Care – PPO | Admitting: Internal Medicine

## 2011-12-08 VITALS — BP 124/80 | HR 58 | Temp 98.3°F | Resp 12 | Ht 74.08 in | Wt 195.2 lb

## 2011-12-08 DIAGNOSIS — Z0000 Encounter for general adult medical examination without abnormal findings: Secondary | ICD-10-CM

## 2011-12-08 MED ORDER — LORAZEPAM 0.5 MG PO TABS: ORAL_TABLET | ORAL | Status: DC

## 2011-12-08 NOTE — Patient Instructions (Addendum)
To prevent sleep dysfunction follow these instructions for sleep hygiene. Do not read, watch TV, or eat in bed. Do not get into bed until you are ready to turn off the light &  to go to sleep. Do not ingest stimulants ( decongestants, diet pills,  caffeine) after the evening meal. Cardiovascular exercise, this can be as simple a program as walking, is recommended 30-45 minutes 3-4 times per week. If you're not exercising you should take 6-8 weeks to build up to this level. Please  schedule fasting Labs : BMET,Lipids, hepatic panel, CBC & dif, TSH.PLEASE BRING THESE INSTRUCTIONS TO FOLLOW UP  LAB APPOINTMENT.This will guarantee correct labs are drawn, eliminating need for repeat blood sampling ( needle sticks ! ). Diagnoses /Codes: V70.0.   If you activate My Chart; the results can be released to you as soon as they populate from the lab. If you choose not to use this program; the labs have to be reviewed, copied & mailed   causing a delay in getting the results to you.

## 2011-12-08 NOTE — Progress Notes (Signed)
  Subjective:    Patient ID: Caleb Jones, male    DOB: 1950-12-13, 61 y.o.   MRN: [id number]  HPI  Mr Knippenberg is here for a physical;he denies acute issues.      Review of Systems He presently walks 2 miles over 60 minutes at least 4 times per week without symptoms. He specifically denies chest pain, palpitations, dyspnea, edema, or claudication. He is on modified heart healthy diet. He is questioning increasing his exercise level.     Objective:   Physical Exam Gen.: Thin but healthy and well-nourished in appearance. Alert, appropriate and cooperative throughout exam. Head: Normocephalic without obvious abnormalities Eyes: No corneal or conjunctival inflammation noted. Pupils equal round reactive to light and accommodation. Fundal exam is benign without hemorrhages, exudate, papilledema. Extraocular motion intact. Vision grossly normal. Ears: External  ear exam reveals no significant lesions or deformities. Canals clear .TMs normal. Hearing is grossly normal bilaterally. Nose: External nasal exam reveals no deformity or inflammation. Nasal mucosa are pink and moist. No lesions or exudates noted.   Mouth: Oral mucosa and oropharynx reveal no lesions or exudates. Teeth in good repair. Neck: No deformities, masses, or tenderness noted. Range of motion & Thyroid normal. Lungs: Normal respiratory effort; chest expands symmetrically. Lungs are clear to auscultation without rales, wheezes, or increased work of breathing. Heart: Normal rate and rhythm. Normal S1 and S2. No gallop, click, or rub. S4 w/o murmur. Abdomen: Bowel sounds normal; abdomen soft and nontender. No masses, organomegaly or hernias noted. Genitalia: Dr Laverle Patter , Urology Musculoskeletal/extremities: No deformity or scoliosis noted of  the thoracic or lumbar spine. No clubbing, cyanosis, edema, or deformity noted. Range of motion  normal .Tone & strength  normal.Joints normal. Nail health  good. Vascular: Carotid, radial artery,  dorsalis pedis and  posterior tibial pulses are full and equal. No bruits present. Neurologic: Alert and oriented x3. Deep tendon reflexes symmetrical and normal.          Skin: Intact but hands dry  without suspicious lesions or rashes. Lymph: No cervical, axillary lymphadenopathy present. Psych: Mood and affect are normal. Normally interactive                                                                                        Assessment & Plan:  #1 comprehensive physical exam; no acute findings. Note: EKG was described as possible low voltage in the V leads. EKG is normal Plan: see Orders

## 2011-12-12 ENCOUNTER — Other Ambulatory Visit (INDEPENDENT_AMBULATORY_CARE_PROVIDER_SITE_OTHER): Payer: BC Managed Care – PPO

## 2011-12-12 DIAGNOSIS — Z0000 Encounter for general adult medical examination without abnormal findings: Secondary | ICD-10-CM

## 2011-12-12 LAB — LIPID PANEL
HDL: 51.7 mg/dL (ref >39.00)
LDL Cholesterol: 103 mg/dL — ABNORMAL HIGH (ref 0–99)
Total CHOL/HDL Ratio: 3
Triglycerides: 100 mg/dL (ref 0.0–149.0)

## 2011-12-12 LAB — CBC WITH DIFFERENTIAL/PLATELET
Eosinophils Relative: 1.6 % (ref 0.0–5.0)
HCT: 44.2 % (ref 39.0–52.0)
Hemoglobin: 14.7 g/dL (ref 13.0–17.0)
Lymphs Abs: 1.7 10*3/uL (ref 0.7–4.0)
Monocytes Relative: 11.4 % (ref 3.0–12.0)
Neutro Abs: 3.9 10*3/uL (ref 1.4–7.7)
Platelets: 194 10*3/uL (ref 150.0–400.0)
WBC: 6.4 10*3/uL (ref 4.5–10.5)

## 2011-12-12 LAB — HEPATIC FUNCTION PANEL: Albumin: 4 g/dL (ref 3.5–5.2)

## 2011-12-12 LAB — TSH: TSH: 2.04 u[IU]/mL (ref 0.35–5.50)

## 2011-12-12 LAB — BASIC METABOLIC PANEL
CO2: 29 mEq/L (ref 19–32)
Chloride: 102 mEq/L (ref 96–112)
Sodium: 138 mEq/L (ref 135–145)

## 2012-06-06 ENCOUNTER — Other Ambulatory Visit: Payer: Self-pay | Admitting: Internal Medicine

## 2012-06-07 NOTE — Telephone Encounter (Signed)
Patient aware Controlled Substance Contract to be sign and rx to be picked up   

## 2012-06-10 ENCOUNTER — Other Ambulatory Visit: Payer: Self-pay | Admitting: Internal Medicine

## 2012-06-11 NOTE — Telephone Encounter (Signed)
Left message on VM requesting patient return call when available. Reason for call(not left on VM) : inform patient of newly developed controlled substance policy. Patient will have to stop by the office to sign a contract prior to rx being filled. Message sent to patient via Mychart also.

## 2012-06-14 NOTE — Telephone Encounter (Signed)
I checked file folder up front and checked with Chanel in Toxicology to verify rx not here. RX called in

## 2012-06-14 NOTE — Telephone Encounter (Signed)
Patient returned your call. Advised pt that rx had been sent to pharmacy. Patient wanted to thank [place] for handling this so promptly. He also states that the contract he signed "scared him a little" because he was not aware this medication was that serious. He would like to discuss the long term effects of using this medication.

## 2012-06-14 NOTE — Telephone Encounter (Signed)
Patient states he came in last week and went back to the lab to give specimen and sign contract. He was never given paper rx for his lorazepam. Can we fax this for him or does he still need to come to the office? Please advise. CB# [phone]

## 2012-06-18 ENCOUNTER — Encounter: Payer: Self-pay | Admitting: Internal Medicine

## 2012-09-02 ENCOUNTER — Telehealth: Payer: Self-pay | Admitting: General Practice

## 2012-09-02 MED ORDER — LORAZEPAM 0.5 MG PO TABS: ORAL_TABLET | ORAL | Status: DC

## 2012-09-02 NOTE — Telephone Encounter (Signed)
Med filled.  

## 2012-09-02 NOTE — Telephone Encounter (Signed)
Lorazepam refill Last OV 12-08-11 Med last filled 06-10-12 #30 with 0 refills

## 2012-09-02 NOTE — Telephone Encounter (Signed)
Refill x1 

## 2012-11-04 ENCOUNTER — Other Ambulatory Visit: Payer: Self-pay | Admitting: Internal Medicine

## 2012-11-04 NOTE — Telephone Encounter (Signed)
OK  X 1; OV before next refill

## 2012-11-04 NOTE — Telephone Encounter (Signed)
LORazepam (ATIVAN) 0.5 MG tablet Last Refill: 09/02/2012 #60 1 refill Last OV: 12/08/2011 UDS up-to-date: Low risk

## 2012-11-11 ENCOUNTER — Other Ambulatory Visit: Payer: Self-pay

## 2013-01-03 ENCOUNTER — Telehealth: Payer: Self-pay

## 2013-01-03 NOTE — Telephone Encounter (Signed)
Medication List and allergies:  Reviewed and updated  90 day supply/mail order: na Local prescriptions[facility] [address] [facility] [address]  Immunizations due: Tdap--patient aware  A/P:   No changes to FH or PSH or personal hx CCS--05/2008--Dr Patterson--next 2020 PSA--07/2010--0.01--Sees Urology--last OV 08/2012 Shingles vaccine--08/2010 Tdap--needs  To Discuss with Provider: Left elbow x 1 month--feels like a sprain

## 2013-01-05 ENCOUNTER — Encounter: Payer: Self-pay | Admitting: Internal Medicine

## 2013-01-05 ENCOUNTER — Ambulatory Visit (INDEPENDENT_AMBULATORY_CARE_PROVIDER_SITE_OTHER): Payer: BC Managed Care – PPO | Admitting: Internal Medicine

## 2013-01-05 VITALS — BP 119/75 | HR 54 | Temp 98.5°F | Ht 74.5 in | Wt 199.4 lb

## 2013-01-05 DIAGNOSIS — Z0000 Encounter for general adult medical examination without abnormal findings: Secondary | ICD-10-CM

## 2013-01-05 MED ORDER — LORAZEPAM 0.5 MG PO TABS: ORAL_TABLET | ORAL | Status: DC

## 2013-01-05 NOTE — Progress Notes (Signed)
Pre visit review using our clinic review tool, if applicable. No additional management support is needed unless otherwise documented below in the visit note. 

## 2013-01-05 NOTE — Patient Instructions (Signed)
Perform the exercises for elbow pain  twice a day as discussed. Apply the anti-inflammatory gel after the exercises. Consider glucosamine sulfate 1500 mg daily for joint symptoms. Take this daily  for 3-4 weeks . This will rehydrate the cartilages. Your next office appointment will be determined based upon review of your pending labs. Those instructions will be transmitted to you through My Chart  .

## 2013-01-05 NOTE — Progress Notes (Signed)
   Subjective:    Patient ID: Caleb Jones, male    DOB: [birth year], 62 y.o.   MRN: [id number]  HPI  He is here for a physical;acute issues denied except L lateral elbow injury 3 weeks ago. Topical cream & heating pad have helped.      Review of Systems A modified heart healthy diet is followed; exercise encompasses  35 minutes 3-4  times per week as walking 1.8 mpd without symptoms.  Family history is negative for premature coronary disease. Low dose ASA not  taken Specifically denied are  chest pain, palpitations, dyspnea, or claudication.  Significant abdominal symptoms, memory deficit, or myalgias not present.     Objective:   Physical Exam Gen.: Healthy and well-nourished in appearance. Alert, appropriate and cooperative throughout exam. Head: Normocephalic without obvious abnormalities  Eyes: No corneal or conjunctival inflammation noted. Pupils equal round reactive to light and accommodation. Extraocular motion intact.  Ears: External  ear exam reveals no significant lesions or deformities. Canals clear .TMs normal. Hearing is grossly normal bilaterally. Nose: External nasal exam reveals no deformity or inflammation. Nasal mucosa are pink and moist. No lesions or exudates noted.  Mouth: Oral mucosa and oropharynx reveal no lesions or exudates. Teeth in good repair. Neck: No deformities, masses, or tenderness noted. Range of motion & Thyroid normal. Lungs: Normal respiratory effort; chest expands symmetrically. Lungs are clear to auscultation without rales, wheezes, or increased work of breathing. Heart: Normal rate and rhythm. Normal S1 and S2. No gallop, click, or rub. S4 w/o murmur. Abdomen: Bowel sounds normal; abdomen soft and nontender. No masses, organomegaly or hernias noted. Genitalia: as per Dr Laverle Patter                             Musculoskeletal/extremities: No deformity or scoliosis noted of  the thoracic or lumbar spine.  No clubbing, cyanosis, edema, or significant  extremity  deformity noted. Range of motion normal .Tone & strength normal. Hand joints normal . Fingernailhealth good. Classic olecranon tenosynovitis on the left. Able to lie down & sit up w/o help. Negative SLR bilaterally Vascular: Carotid, radial artery, dorsalis pedis and  posterior tibial pulses are full and equal. No bruits present. Neurologic: Alert and oriented x3. Deep tendon reflexes symmetrical and normal.       Skin: Intact without suspicious lesions or rashes. Lymph: No cervical, axillary lymphadenopathy present. Psych: Mood and affect are normal. Normally interactive                                                                                         Assessment & Plan:  #1 comprehensive physical exam; no acute findings # 2 tenosynovitis  Plan: see Orders  & Recommendations

## 2013-01-07 ENCOUNTER — Other Ambulatory Visit: Payer: BC Managed Care – PPO

## 2013-01-24 ENCOUNTER — Other Ambulatory Visit (INDEPENDENT_AMBULATORY_CARE_PROVIDER_SITE_OTHER): Payer: BC Managed Care – PPO

## 2013-01-24 DIAGNOSIS — Z0000 Encounter for general adult medical examination without abnormal findings: Secondary | ICD-10-CM

## 2013-01-24 LAB — CBC WITH DIFFERENTIAL/PLATELET
Basophils Absolute: 0 10*3/uL (ref 0.0–0.1)
Basophils Relative: 0.4 % (ref 0.0–3.0)
Eosinophils Absolute: 0.2 10*3/uL (ref 0.0–0.7)
Eosinophils Relative: 1.9 % (ref 0.0–5.0)
HEMATOCRIT: 43.9 % (ref 39.0–52.0)
HEMOGLOBIN: 14.7 g/dL (ref 13.0–17.0)
LYMPHS ABS: 2.5 10*3/uL (ref 0.7–4.0)
LYMPHS PCT: 29.8 % (ref 12.0–46.0)
MCHC: 33.4 g/dL (ref 30.0–36.0)
MCV: 91.2 fl (ref 78.0–100.0)
Monocytes Absolute: 1.1 10*3/uL — ABNORMAL HIGH (ref 0.1–1.0)
Monocytes Relative: 13.1 % — ABNORMAL HIGH (ref 3.0–12.0)
NEUTROS ABS: 4.6 10*3/uL (ref 1.4–7.7)
Neutrophils Relative %: 54.8 % (ref 43.0–77.0)
Platelets: 228 10*3/uL (ref 150.0–400.0)
RBC: 4.81 Mil/uL (ref 4.22–5.81)
RDW: 13 % (ref 11.5–14.6)
WBC: 8.5 10*3/uL (ref 4.5–10.5)

## 2013-01-24 LAB — BASIC METABOLIC PANEL
BUN: 9 mg/dL (ref 6–23)
CHLORIDE: 105 meq/L (ref 96–112)
CO2: 27 meq/L (ref 19–32)
CREATININE: 1.3 mg/dL (ref 0.4–1.5)
Calcium: 8.7 mg/dL (ref 8.4–10.5)
GFR: 60.4 mL/min (ref >60.00)
Glucose, Bld: 87 mg/dL (ref 70–99)
POTASSIUM: 4.1 meq/L (ref 3.5–5.1)
SODIUM: 140 meq/L (ref 135–145)

## 2013-01-24 LAB — HEPATIC FUNCTION PANEL
ALK PHOS: 53 U/L (ref 39–117)
ALT: 19 U/L (ref 0–53)
AST: 19 U/L (ref 0–37)
Albumin: 4 g/dL (ref 3.5–5.2)
Bilirubin, Direct: 0.1 mg/dL (ref 0.0–0.3)
Total Bilirubin: 0.8 mg/dL (ref 0.3–1.2)
Total Protein: 7.3 g/dL (ref 6.0–8.3)

## 2013-01-24 LAB — TSH: TSH: 3.94 u[IU]/mL (ref 0.35–5.50)

## 2013-01-24 LAB — LIPID PANEL
Cholesterol: 177 mg/dL (ref 0–200)
HDL: 51.4 mg/dL (ref >39.00)
LDL Cholesterol: 104 mg/dL — ABNORMAL HIGH (ref 0–99)
Total CHOL/HDL Ratio: 3
Triglycerides: 109 mg/dL (ref 0.0–149.0)
VLDL: 21.8 mg/dL (ref 0.0–40.0)

## 2013-03-10 ENCOUNTER — Encounter: Payer: Self-pay | Admitting: Internal Medicine

## 2013-07-05 ENCOUNTER — Other Ambulatory Visit: Payer: Self-pay | Admitting: Internal Medicine

## 2013-07-05 NOTE — Telephone Encounter (Signed)
OK X1 

## 2013-09-29 ENCOUNTER — Other Ambulatory Visit: Payer: Self-pay | Admitting: Internal Medicine

## 2013-09-29 MED ORDER — LORAZEPAM 0.5 MG PO TABS: ORAL_TABLET | ORAL | Status: DC

## 2013-09-29 NOTE — Telephone Encounter (Signed)
Patient is requesting refill on lorazepam

## 2013-09-29 NOTE — Telephone Encounter (Signed)
OK X1 

## 2013-12-21 ENCOUNTER — Encounter: Payer: Self-pay | Admitting: Internal Medicine

## 2013-12-21 ENCOUNTER — Ambulatory Visit (INDEPENDENT_AMBULATORY_CARE_PROVIDER_SITE_OTHER): Payer: BC Managed Care – PPO | Admitting: Internal Medicine

## 2013-12-21 VITALS — BP 112/70 | HR 56 | Temp 97.9°F | Resp 14 | Wt 198.8 lb

## 2013-12-21 DIAGNOSIS — G479 Sleep disorder, unspecified: Secondary | ICD-10-CM

## 2013-12-21 MED ORDER — CLONAZEPAM 0.5 MG PO TABS: ORAL_TABLET | ORAL | Status: DC

## 2013-12-21 NOTE — Assessment & Plan Note (Signed)
Sleep hygiene CVE Clonazepam trial

## 2013-12-21 NOTE — Progress Notes (Signed)
   Subjective:    Patient ID: Caleb Jones, male    DOB: [birth year], 63 y.o.   MRN: [id number]  HPI   He has been compliant with lorazepam for sleep disruption without adverse effect. He specifically denies altered mental status or imbalance.[name] He states he sleeps for 4-5 hours which is restful.  His wife describe slight snoring but no apnea  He occasionally drinks tea at night before bed and occasionally watches TV in bed.  He does do pelvic exercises in the morning prescribed by his urologist. He does not do cardio exercise  His urologist now will monitor him annually following his prostate surgery for cancer.  Review of Systems    He does describe nocturia once every other night.     Objective:   Physical Exam   Gen.:  well-nourished; in no acute distress Eyes: Extraocular motion intact; no lid lag or proptosis ,no nystagmus Neck: full ROM; no masses ; thyroid normal  Heart: Normal rhythm and rate without significant murmur, gallop, or extra heart sounds Lungs: Chest clear to auscultation without rales,rales, wheezes Neuro:Deep tendon reflexes are equal and within normal limits; no tremor  Skin: Warm and dry without significant lesions or rashes; no onycholysis Lymphatic: no cervical or axillary LA Psych: Normally communicative and interactive; no abnormal mood or affect clinically.           Assessment & Plan:  See Current Assessment & Plan in Problem List under specific Diagnosis

## 2013-12-21 NOTE — Progress Notes (Signed)
Pre visit review using our clinic review tool, if applicable. No additional management support is needed unless otherwise documented below in the visit note. 

## 2013-12-21 NOTE — Patient Instructions (Signed)
To prevent sleep dysfunction follow these instructions for sleep hygiene. Do not read, watch TV, or eat in bed. Do not get into bed until you are ready to turn off the light &  to go to sleep. Do not ingest stimulants ( decongestants, diet pills, nicotine, caffeine) after the evening meal.Do not take daytime naps.Cardiovascular exercise, this can be as simple a program as walking, is recommended 30-45 minutes 3-4 times per week. If you're not exercising you should take 6-8 weeks to build up to this level. 

## 2014-04-25 ENCOUNTER — Emergency Department (HOSPITAL_COMMUNITY)
Admission: EM | Admit: 2014-04-25 | Discharge: 2014-04-26 | Disposition: A | Discharge status: Home / Self Care | Payer: BLUE CROSS/BLUE SHIELD | Attending: Emergency Medicine | Admitting: Emergency Medicine

## 2014-04-25 ENCOUNTER — Emergency Department (HOSPITAL_COMMUNITY): Payer: BLUE CROSS/BLUE SHIELD

## 2014-04-25 ENCOUNTER — Encounter (HOSPITAL_COMMUNITY): Payer: Self-pay | Admitting: Emergency Medicine

## 2014-04-25 ENCOUNTER — Encounter (HOSPITAL_COMMUNITY)
Admission: EM | Disposition: A | Discharge status: Home / Self Care | Payer: Self-pay | Source: Home / Self Care | Attending: Emergency Medicine

## 2014-04-25 DIAGNOSIS — T18128A Food in esophagus causing other injury, initial encounter: Secondary | ICD-10-CM | POA: Diagnosis not present

## 2014-04-25 DIAGNOSIS — J690 Pneumonitis due to inhalation of food and vomit: Secondary | ICD-10-CM

## 2014-04-25 DIAGNOSIS — K222 Esophageal obstruction: Secondary | ICD-10-CM

## 2014-04-25 DIAGNOSIS — W44F3XA Food entering into or through a natural orifice, initial encounter: Secondary | ICD-10-CM

## 2014-04-25 DIAGNOSIS — Z8546 Personal history of malignant neoplasm of prostate: Secondary | ICD-10-CM | POA: Diagnosis not present

## 2014-04-25 DIAGNOSIS — S27813A Laceration of esophagus (thoracic part), initial encounter: Secondary | ICD-10-CM | POA: Diagnosis not present

## 2014-04-25 DIAGNOSIS — F419 Anxiety disorder, unspecified: Secondary | ICD-10-CM | POA: Diagnosis not present

## 2014-04-25 DIAGNOSIS — Z79899 Other long term (current) drug therapy: Secondary | ICD-10-CM | POA: Diagnosis not present

## 2014-04-25 DIAGNOSIS — T18120A Food in esophagus causing compression of trachea, initial encounter: Secondary | ICD-10-CM | POA: Insufficient documentation

## 2014-04-25 DIAGNOSIS — Y998 Other external cause status: Secondary | ICD-10-CM | POA: Insufficient documentation

## 2014-04-25 DIAGNOSIS — Y9389 Activity, other specified: Secondary | ICD-10-CM | POA: Insufficient documentation

## 2014-04-25 DIAGNOSIS — Y9289 Other specified places as the place of occurrence of the external cause: Secondary | ICD-10-CM | POA: Diagnosis not present

## 2014-04-25 DIAGNOSIS — R1314 Dysphagia, pharyngoesophageal phase: Secondary | ICD-10-CM

## 2014-04-25 DIAGNOSIS — X58XXXA Exposure to other specified factors, initial encounter: Secondary | ICD-10-CM | POA: Insufficient documentation

## 2014-04-25 DIAGNOSIS — S1121XA Laceration without foreign body of pharynx and cervical esophagus, initial encounter: Secondary | ICD-10-CM

## 2014-04-25 HISTORY — PX: ESOPHAGOGASTRODUODENOSCOPY: SHX5428

## 2014-04-25 LAB — CBC WITH DIFFERENTIAL/PLATELET
BASOS PCT: 0 % (ref 0–1)
Basophils Absolute: 0.1 10*3/uL (ref 0.0–0.1)
Eosinophils Absolute: 0.1 10*3/uL (ref 0.0–0.7)
Eosinophils Relative: 1 % (ref 0–5)
HCT: 45.5 % (ref 39.0–52.0)
Hemoglobin: 15.4 g/dL (ref 13.0–17.0)
LYMPHS ABS: 1.8 10*3/uL (ref 0.7–4.0)
LYMPHS PCT: 13 % (ref 12–46)
MCH: 31.2 pg (ref 26.0–34.0)
MCHC: 33.8 g/dL (ref 30.0–36.0)
MCV: 92.3 fL (ref 78.0–100.0)
MONOS PCT: 6 % (ref 3–12)
Monocytes Absolute: 0.8 10*3/uL (ref 0.1–1.0)
NEUTROS ABS: 10.7 10*3/uL — AB (ref 1.7–7.7)
NEUTROS PCT: 80 % — AB (ref 43–77)
Platelets: 220 10*3/uL (ref 150–400)
RBC: 4.93 MIL/uL (ref 4.22–5.81)
RDW: 12.8 % (ref 11.5–15.5)
WBC: 13.5 10*3/uL — AB (ref 4.0–10.5)

## 2014-04-25 LAB — BASIC METABOLIC PANEL
Anion gap: 6 (ref 5–15)
BUN: 13 mg/dL (ref 6–23)
CO2: 26 mmol/L (ref 19–32)
CREATININE: 1.19 mg/dL (ref 0.50–1.35)
Calcium: 9.2 mg/dL (ref 8.4–10.5)
Chloride: 106 mmol/L (ref 96–112)
GFR calc Af Amer: 73 mL/min — ABNORMAL LOW (ref >90)
GFR calc non Af Amer: 63 mL/min — ABNORMAL LOW (ref >90)
GLUCOSE: 110 mg/dL — AB (ref 70–99)
Potassium: 3.8 mmol/L (ref 3.5–5.1)
Sodium: 138 mmol/L (ref 135–145)

## 2014-04-25 SURGERY — EGD (ESOPHAGOGASTRODUODENOSCOPY)
Anesthesia: Moderate Sedation

## 2014-04-25 MED ORDER — BUTAMBEN-TETRACAINE-BENZOCAINE 2-2-14 % EX AERO
INHALATION_SPRAY | CUTANEOUS | Status: DC | PRN
  Administered 2014-04-25: 1 via TOPICAL

## 2014-04-25 MED ORDER — MIDAZOLAM HCL 10 MG/2ML IJ SOLN
INTRAMUSCULAR | Status: AC
  Filled 2014-04-25: qty 4

## 2014-04-25 MED ORDER — DIPHENHYDRAMINE HCL 50 MG/ML IJ SOLN
INTRAMUSCULAR | Status: DC | PRN
  Administered 2014-04-25: 25 mg via INTRAVENOUS

## 2014-04-25 MED ORDER — ONDANSETRON HCL 4 MG/2ML IJ SOLN
4.0000 mg | Freq: Once | INTRAMUSCULAR | Status: AC
  Administered 2014-04-25: 4 mg via INTRAVENOUS
  Filled 2014-04-25: qty 2

## 2014-04-25 MED ORDER — FENTANYL CITRATE (PF) 100 MCG/2ML IJ SOLN
INTRAMUSCULAR | Status: AC
  Filled 2014-04-25: qty 4

## 2014-04-25 MED ORDER — DIPHENHYDRAMINE HCL 50 MG/ML IJ SOLN
INTRAMUSCULAR | Status: AC
  Filled 2014-04-25: qty 1

## 2014-04-25 MED ORDER — FENTANYL CITRATE (PF) 100 MCG/2ML IJ SOLN
INTRAMUSCULAR | Status: DC | PRN
  Administered 2014-04-25 (×5): 25 ug via INTRAVENOUS

## 2014-04-25 MED ORDER — MIDAZOLAM HCL 10 MG/2ML IJ SOLN
INTRAMUSCULAR | Status: DC | PRN
  Administered 2014-04-25 (×6): 2 mg via INTRAVENOUS

## 2014-04-25 MED ORDER — SODIUM CHLORIDE 0.9 % IV SOLN
INTRAVENOUS | Status: DC
  Administered 2014-04-26: 500 mL via INTRAVENOUS

## 2014-04-25 MED ORDER — GLUCAGON HCL RDNA (DIAGNOSTIC) 1 MG IJ SOLR
1.0000 mg | Freq: Once | INTRAMUSCULAR | Status: AC
  Administered 2014-04-25: 1 mg via INTRAVENOUS
  Filled 2014-04-25: qty 1

## 2014-04-25 MED ORDER — SODIUM CHLORIDE 0.9 % IV BOLUS (SEPSIS)
1000.0000 mL | Freq: Once | INTRAVENOUS | Status: AC
  Administered 2014-04-25: 1000 mL via INTRAVENOUS

## 2014-04-25 NOTE — ED Notes (Signed)
Pt reports eating steak around pm. Pt states he feels like piece of steak is stick in throat but reports to EMS he feels better than after he initially ate steak. Pt having difficulty swallowing and increased salivation.  Pt is alert and oriented.

## 2014-04-25 NOTE — Op Note (Signed)
[facility] [place], [address]   1ENDOSCOPY PROCEDURE REPORT  PATIENT: [name], [name]  MR#: [id number] BIRTHDATE: Jun 03, 1950 , 58  yrs. old GENDER: male ENDOSCOPIST: Eustace Quail, MD REFERRED BY:  Lane Hacker, M.D. PROCEDURE DATE:  04/25/2014 PROCEDURE:  EGD w/ foreign body removal ASA CLASS:     Class II INDICATIONS:  therapeutic procedure.. Acute food impaction MEDICATIONS: Fentanyl 125 mcg IV, Versed 12.5 mg IV, and Benadryl 25 mg IV TOPICAL ANESTHETIC: Cetacaine Spray  DESCRIPTION OF PROCEDURE: After the risks benefits and alternatives of the procedure were thoroughly explained, informed consent was obtained.  The Pentax Gastroscope [id number] endoscope was introduced through the mouth and advanced to the second portion of the duodenum , Without limitations.  The instrument was slowly withdrawn as the mucosa was fully examined.  EXAM:The proximal one half of the esophagus was densely packed with meat and vegetative materials as well as secretions.  Vigorous suctioning was performed throughout the case both within the esophagus and in the oropharynx.  The material was tediously removed with Jabier Mutton net and suction technique.  After removing a large portion of the impacted food material, a large linear tear secondary to the impacted food was apparent.  The last smaller bolus of food near the gastroesophageal junction was easily advanced into the stomach.  The linear tear was present from the gastroesophageal junction and extended proximal 9 cm.. This appeared contained  There was food impacted in the distal portion of the tear.  The distal esophagus revealed a friable benign appearing ringlike stricture.  Stomach revealed large volumes of food and fluid but was otherwise unremarkable as was the duodenum. Retroflexed views revealed no abnormalities. The examination took 30 minutes.    The scope was then withdrawn from the patient and the  procedure completed.  COMPLICATIONS: There were no immediate complications.  ENDOSCOPIC IMPRESSION: 1. Large food impaction relieved endoscopically (see report above) 2. Impacted Large Food bolus related linear tear of the esophagus, likely contained  RECOMMENDATIONS: (1).  Return to emergency room as the patient will need a STAT Gastrografin swallow this evening. If the Gastrografin swallow is negative for perforation, then discharged home. If not, then admitted and contact thoracic surgery.   (2) IF GASTROGRAFIN SWALLOW NEGATIVE THEN:  1. Clear liquids for 48 hours 2. Initiate Prilosec OTC 20 mg daily starting tomorrow 3. My office will contact you to arrange appropriate follow-up. If you do not hear from [place], please call [phone] and ask for Dr. Blanch Media nurse, Vaughan Basta  Discussed with patient's sister   REPEAT EXAM:  eSigned:  Eustace Quail, MD 04/25/2014 11:58 PM    CC:The Patient and Hendricks Limes, MD  PATIENT NAME:  Caleb Jones, Caleb Jones MR#: [id number]

## 2014-04-25 NOTE — ED Provider Notes (Signed)
CSN: [id number]     Arrival date & time 04/25/14  1933 History   First MD Initiated Contact with Patient 04/25/14 1956     Chief Complaint  Patient presents with  . Food stuck in throat      (Consider location/radiation/quality/duration/timing/severity/associated sxs/prior Treatment) HPI Caleb Jones is a 64 year old male with past medical history of prostate cancer who presents the ER complaining of sensation of food stuck in his throat. Patient reports eating steak around 5:30 or 6:00 PM this afternoon, reports feeling one piece of the state having sensation that it became lodged in his esophagus. Patient states he has tried multiple times to reproduce the piece of food vomiting, however has not been able to vomit. Patient states he has been continuously "spitting up" his saliva since then. Patient states he has tried to drink Coca-[employer]a, approximately one to 2 ounces, and was unable to keep this down as well. Patient denies shortness of breath, fever, lightheadedness, dizziness, syncope, weakness, abdominal pain, nausea, vomiting, diarrhea.  Past Medical History  Diagnosis Date  . History of prostate cancer 11/20/2008    Dr Alinda Money  . Pneumonia 01/2009    pleural effusion tapped  . Anxiety    Past Surgical History  Procedure Laterality Date  . Refractive surgery  2007    for Retina Dethachment, Dr Jalene Mullet , [facility]  . Tonsillectomy    . Prostatectomy  11/20/2008    Dr Dutch Gray  . Colonoscopy  04/2008    Dr Sharlett Iles  . Esophagogastroduodenoscopy N/A 04/25/2014    Procedure: ESOPHAGOGASTRODUODENOSCOPY (EGD);  Surgeon: Irene Shipper, MD;  Location: [facility];  Service: Endoscopy;  Laterality: N/A;   Family History  Problem Relation Age of Onset  . Dementia Mother   . Hypertension Mother   . Transient ischemic attack Mother     > 79  . Prostate cancer Father 57     metastatic to bones, lungs & cns  . Breast cancer Sister     <50  . Diabetes Maternal Aunt   . Heart disease  Neg Hx    History  Substance Use Topics  . Smoking status: Never Smoker   . Smokeless tobacco: Not on file  . Alcohol Use: 0.6 oz/week    1 Cans of beer per week     Comment: Socially    Review of Systems  Constitutional: Negative for fever.  HENT: Negative for trouble swallowing.   Eyes: Negative for visual disturbance.  Respiratory: Negative for shortness of breath.   Cardiovascular: Negative for chest pain.  Gastrointestinal: Negative for nausea, vomiting and abdominal pain.  Genitourinary: Negative for dysuria.  Musculoskeletal: Negative for neck pain.  Skin: Negative for rash.  Neurological: Negative for dizziness, weakness and numbness.  Psychiatric/Behavioral: Negative.       Allergies  Review of patient's allergies indicates no known allergies.  Home Medications   Prior to Admission medications   Medication Sig Start Date End Date Taking? Authorizing Provider  clonazePAM (KLONOPIN) 0.5 MG tablet 1 qhs prn Patient taking differently: Take 0.5 mg by mouth at bedtime.  12/21/13  Yes Hendricks Limes, MD  levofloxacin (LEVAQUIN) 750 MG tablet Take 1 tablet (750 mg total) by mouth daily. X 7 days 04/26/14   Dahlia Bailiff, PA-C   BP 131/69 mmHg  Pulse 67  Temp(Src) 98.5 F (36.9 C) (Oral)  Resp 21  SpO2 92% Physical Exam  Constitutional: He is oriented to person, place, and time. He appears well-developed and well-nourished. No distress.  Male sitting upright in the room, spitting secretions into emesis bag.  Well appearing, in no acute distress.    HENT:  Head: Normocephalic and atraumatic.  Mouth/Throat: Oropharynx is clear and moist. No oropharyngeal exudate.  Eyes: Right eye exhibits no discharge. Left eye exhibits no discharge. No scleral icterus.  Neck: Normal range of motion.  Cardiovascular: Normal rate, regular rhythm and normal heart sounds.   No murmur heard. Pulmonary/Chest: Effort normal and breath sounds normal. No respiratory distress.  Abdominal:  Soft. There is no tenderness.  Musculoskeletal: Normal range of motion. He exhibits no edema or tenderness.  Neurological: He is alert and oriented to person, place, and time. No cranial nerve deficit. Coordination normal.  Skin: Skin is warm and dry. No rash noted. He is not diaphoretic.  Psychiatric: He has a normal mood and affect.  Nursing note and vitals reviewed.   ED Course  Procedures (including critical care time) Labs Review Labs Reviewed  CBC WITH DIFFERENTIAL/PLATELET - Abnormal; Notable for the following:    WBC 13.5 (*)    Neutrophils Relative % 80 (*)    Neutro Abs 10.7 (*)    All other components within normal limits  BASIC METABOLIC PANEL - Abnormal; Notable for the following:    Glucose, Bld 110 (*)    GFR calc non Af Amer 63 (*)    GFR calc Af Amer 73 (*)    All other components within normal limits    Imaging Review Dg Chest 2 View  04/25/2014   CLINICAL DATA:  Possible food impaction. Vomiting. Short of breath.  EXAM: CHEST  2 VIEW  COMPARISON:  02/20/2009  FINDINGS: Heart size is normal. Mediastinal shadows are normal. The lungs are clear. No effusions. No bony abnormalities. Probable background emphysema.  IMPRESSION: No active disease. No evidence of mediastinal pathology. Emphysema.   Electronically Signed   By: Nelson Chimes M.D.   On: 04/25/2014 20:46   Dg Esophagus W/water Sol Cm  04/26/2014   CLINICAL DATA:  Status post EGD for removal of acute food bolus. Evaluate for tear  EXAM: ESOPHOGRAM/BARIUM SWALLOW  TECHNIQUE: Single contrast examination was performed using isotonic water-soluble contrast.  FLUOROSCOPY TIME:  Radiation Exposure Index (as provided by the fluoroscopic device): Not available on this device  If the device does not provide the exposure index:  Fluoroscopy Time:  2 minutes 13 seconds  Number of Acquired Images:  20 series  COMPARISON:  None.  FINDINGS: Scout imaging was performed. There was notable opacification at the left base which is new  from 04/25/2014 chest x-ray. There is also new this linear gas outlining the proximal stomach. No gas under the right diaphragm to suggest free pneumoperitoneum.  Esophagram notable for deep laceration/fissure in the posterior esophagus, distal third, extending to the GE junction. Contrast within this fissure cleared, and there was no extra luminal contrast to suggest a transmural esophageal leak. No proximal gastric leak encountered.  These results were called by telephone at the time of interpretation on 04/26/2014 at 2:00 am to Dr. Kyla Balzarine, who verbally acknowledged these results.  IMPRESSION: 1. Long deep fissure in the distal esophagus. No extraluminal contrast leakage/perforation. 2. Left lower lobe aspiration pneumonitis. 3. Proximal gastric pneumatosis.   Electronically Signed   By: Monte Fantasia M.D.   On: 04/26/2014 02:05     EKG Interpretation None      MDM   Final diagnoses:  Esophagus tear  Esophageal obstruction due to food impaction  Aspiration pneumonitis  Attempted glucagon in the ED for patients most likely esophageal food impaction. Glucagon without significant results, patient still having difficulty tolerating secretions. Spoke with Dr. Henrene Pastor with gastroenterology who recommends patient undergo an endoscopy.  Patient sent to endoscopy, spoke with Dr. Henrene Pastor again status post endoscopy. Dr. Henrene Pastor states patient had food impaction cleared, did note a esophageal mucosal tear. States he is sending patient back to the ED for a gastrograph. Dr. Henrene Pastor states if there is perforation noted with this study he is to be contacted back so that they are able to speak with a thoracic surgeon. He states if this is study is normal patient can be discharged home to follow up with him as an outpatient.  Clerance Lav results show a linear esophageal tear with no perforation. Also noted patient has some aspiration pneumonitis. Also noted some gastric pneumatosis. I discussed these results with Dr.  Henrene Pastor, Dr. Henrene Pastor states that the pneumatosis in patient's stomach is likely due to tenting from the esophageal tear. He states these results are not indicative of anything concerning that would require admission. Patient placed under observation in the emergency department while anesthesia was wearing off. Patient met criteria for discharge based on Aldrete score >9, however patient was observed to allow anesthesia to wear off based on family's request. Patient signed out to Florene Glen, NP-C 2 follow-up on patient after by mouth challenge, and discharged home once anesthesia wears off. Patient has strict instructions from Dr. Henrene Pastor to follow-up with him as an outpatient, begin PPI tomorrow. Patient discharged with Levaquin for coverage of aspiration pneumonia.  Signed,  Dahlia Bailiff, PA-C 1:17 AM  Patient seen and discussed with Dr. Nat Christen, M.D.  Dahlia Bailiff, PA-C 04/27/14 [id number]  Nat Christen, MD 04/29/14 [phone]

## 2014-04-25 NOTE — Consult Note (Signed)
HISTORY OF PRESENT ILLNESS:  Caleb Jones is a 64 y.o. male with a history of chronic anxiety and prostate cancer who I am asked to see in consultation by Dr. Lacinda Axon the emergency room regarding acute dysphagia. 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 With the second bite of steak he developed acute dysphagia with inability to swallow his secretions. This has persisted and he presents to the emergency room. Patient has been spitting in a basin with mild chest discomfort, but is otherwise well without other complaints. He has had colonoscopy in 2010 with Dr. Verl Blalock. GI review of systems otherwise negative. He denies significant history of reflux or weight loss. He has had some mild intermittent dysphagia for 6 months.  REVIEW OF SYSTEMS:  All non-GI ROS negative except for anxiety  Past Medical History  Diagnosis Date  . History of prostate cancer 11/20/2008    Dr Alinda Money  . Pneumonia 01/2009    pleural effusion tapped  . Anxiety     Past Surgical History  Procedure Laterality Date  . Refractive surgery  2007    for Retina Dethachment, Dr Jalene Mullet , [facility]  . Tonsillectomy    . Prostatectomy  11/20/2008    Dr Dutch Gray  . Colonoscopy  04/2008    Dr Sharlett Iles    Social History Caleb Jones  reports that he has never smoked. He does not have any smokeless tobacco history on file. He reports that he drinks about 0.6 oz of alcohol per week. He reports that he does not use illicit drugs.  family history includes Breast cancer in his sister; Dementia in his mother; Diabetes in his maternal aunt; Hypertension in his mother; Prostate cancer (age of onset: 44) in his father; Transient ischemic attack in his mother. There is no history of Heart disease.  No Known Allergies     PHYSICAL EXAMINATION: Vital signs: BP 137/89 mmHg  Pulse 61  Temp(Src) 98.5 F (36.9 C) (Oral)  Resp 18  SpO2 100%  Constitutional:  generally well-appearing, no acute distress. Spitting invasive Psychiatric: alert and oriented x3, cooperative Eyes: extraocular movements intact, anicteric, conjunctiva pink Mouth: oral pharynx moist, no lesions Neck: supple no lymphadenopathy Cardiovascular: heart regular rate and rhythm, no murmur Lungs: clear to auscultation bilaterally Abdomen: soft, nontender, nondistended, no obvious ascites, no peritoneal signs, normal bowel sounds, no organomegaly Rectal: Omitted Extremities: no lower extremity edema bilaterally Skin: no lesions on visible extremities Neuro: No focal deficits.   ASSESSMENT:  #1. Acute food impaction   PLAN:  #1. Emergent EGD.The nature of the procedure, as well as the risks, benefits, and alternatives were carefully and thoroughly reviewed with the patient. Ample time for discussion and questions allowed. The patient understood, was satisfied, and agreed to proceed.  Docia Chuck. Geri Seminole., M.D. [facility] Division of Gastroenterology  A copy of this consultation note sent to Dr. Lacinda Axon

## 2014-04-25 NOTE — ED Notes (Signed)
Bed: WA03 Expected date:  Expected time:  Means of arrival:  Comments: EMS 64 yo nausea and choking sensation-foreign body after eating dinner

## 2014-04-26 ENCOUNTER — Encounter (HOSPITAL_COMMUNITY): Payer: Self-pay | Admitting: Internal Medicine

## 2014-04-26 ENCOUNTER — Telehealth: Payer: Self-pay

## 2014-04-26 ENCOUNTER — Emergency Department (HOSPITAL_COMMUNITY): Payer: BLUE CROSS/BLUE SHIELD

## 2014-04-26 MED ORDER — LEVOFLOXACIN 750 MG PO TABS: 750.0000 mg | ORAL_TABLET | Freq: Every day | ORAL | Status: DC

## 2014-04-26 MED ORDER — IOHEXOL 300 MG/ML  SOLN
100.0000 mL | Freq: Once | INTRAMUSCULAR | Status: AC | PRN
Start: 2014-04-26 — End: 2014-04-26
  Administered 2014-04-26: 150 mL via ORAL

## 2014-04-26 NOTE — ED Provider Notes (Signed)
Patient had an endoscopy to remove a piece of steak by Dr.  Juluis Pitch been observed in the emergency department until he recovered from his anesthesia.  He is alert, appropriate, ambulatory.  He has discharge instructions per Dr. Loni Muse to follow-up in the office  Junius Creamer, NP 04/26/14 [place], DO 04/26/14 [id number]

## 2014-04-26 NOTE — ED Provider Notes (Signed)
At time of discharge patient again states that he is just not feeling well.  He feels dehydrated, although he has no specific complaints. He'll be given by mouth fluids, observation will be continued  Caleb Creamer, NP 05/01/14 2008  [place], DO 05/01/14 2218

## 2014-04-26 NOTE — Telephone Encounter (Signed)
Pt calling @ 1740 tonight with temp of 100.4. No other symptoms. Results from last night reviewed. Suspect pneumonitis is causing fever. Tylenol q6 tonight. Contact Dr Linna Darner tomorrow for follow up. Keep follow with Dr Henrene Pastor as scheduled.

## 2014-04-26 NOTE — Telephone Encounter (Signed)
Per Dr. Henrene Pastor pt needs to stay on liquids for several days and then advance to soft foods. Needs to avoid breads as they will get stuck. Pt scheduled to see Dr. Henrene Pastor 05/17/14@9am .   Left message for pt to call back.

## 2014-04-26 NOTE — Discharge Instructions (Signed)
Swallowed Foreign Body, Adult You have swallowed an object (foreign body). Once the foreign body has passed through the food tube (esophagus), which leads from the mouth to the stomach, it will usually continue through the body without problems. This is because the point where the esophagus enters into the stomach is the narrowest place through which the foreign body must pass. Sometimes the foreign body gets stuck. The most common type of foreign body obstruction in adults is food impaction. Many times, bones from fish or meat products may become lodged in the esophagus or injure the throat on the way down. When there is an object that obstructs the esophagus, the most obvious symptoms are pain and the inability to swallow normally. In some cases, foreign bodies that can be life threatening are swallowed. Examples of these are certain medications and illicit drugs. Often in these instances, patients are afraid of telling what they swallowed. However, it is extremely important to tell the emergency caregiver what was swallowed because life-saving treatment may be needed.  X-ray exams may be taken to find the location of the foreign body. However, some objects do not show up well or may be too small to be seen on an X-ray image. If the foreign body is too large or too sharp, it may be too dangerous to allow it to pass on its own. You may need to see a caregiver who specializes in the digestive system (gastroenterologist). In a few cases, a specialist may need to remove the object using a method called "endoscopy". This involves passing a thin, soft, flexible tube into the food pipe to locate and remove the object. Follow up with your primary doctor or the referral you were given by the emergency caregiver. HOME CARE INSTRUCTIONS   If your caregiver says it is safe for you to eat, then only have liquids and soft foods until your symptoms improve.  Once you are eating normally:  Cut food into small  pieces.  Remove small bones from food.  Remove large seeds and pits from fruit.  Chew your food well.  Do not talk, laugh, or engage in physical activity while eating or swallowing. SEEK MEDICAL CARE IF:  You develop worsening shortness of breath, uncontrollable coughing, chest pains or high fever, greater than 102 F (38.9 C).  You are unable to eat or drink or you feel that food is getting stuck in your throat.  You have choking symptoms or cannot stop drooling.  You develop abdominal pain, vomiting (especially of blood), or rectal bleeding. MAKE SURE YOU:   Understand these instructions.  Will watch your condition.  Will get help right away if you are not doing well or get worse. Document Released: 06/12/2009 Document Revised: 03/17/2011 Document Reviewed: 06/12/2009 [facility] Patient Information 2015 [place], [place]. This information is not intended to replace advice given to you by your health care provider. Make sure you discuss any questions you have with your health care provider.  Aspiration Precautions Aspiration is the inhaling of a liquid or object into the lungs. Things that can be inhaled into the lungs include:  Food.  Any type of liquid, such as drinks or saliva.  Stomach contents, such as vomit or stomach acid. When these things go into the lungs, damage can occur. Serious complications can then result, such as:  A lung infection (pneumonia).  A collection of pus in the lungs (lung abscess). CAUSES  A decreased level of awareness (consciousness) due to:  Traumatic brain injury or head  injury.  Stroke.  Neurological disease.  Seizures.  Decreased or absent gag reflex (inability to cough).  Medical conditions that affect swallowing.  Conditions that affect the food pipe (esophagus) such as a narrowing of the esophagus (esophageal stricture).  Gastroesophageal reflux (GERD). This is also known as acid reflux.  Any type of surgery where you  are put under general anesthesia or have sedation.  Drinking large amounts of alcohol.  Taking medication that causes drowsiness, confusion, or weakness.  Aging.  Dental problems.  Having a feeding tube. SYMPTOMS When aspiration occurs, different signs and symptoms can occur, such as:  Coughing (if a person has a cough or gag reflex) after swallowing food or liquids.  Difficulty breathing. This can include things like:  Breathing rapidly.  Breathing very slowly.  Loud breathing.  Hearing "gurgling" lung sounds when a person breaths.  Coughing up phlegm (sputum) that is:  Yellow, tan, or green in color.  Has pieces of food in it.  Bad smelling.  A change in voice (hoarseness) or a "gurgly" sound to the voice.  A change in skin color. The skin may turn red, or a "bluish" type color because of a lack of oxygen (cyanosis).  Fever.  Eyes watering.  Pain in the chest or back.  Facial grimacing .  A feeling of fullness in the throat or that something is stuck in the throat. DIAGNOSIS  A chest X-ray may be performed. This takes a picture of your lungs. It can show changes in the lungs if aspiration has occurred.  A bronchoscopy may be performed. This is a surgical procedure in which a thin, flexible tube with a camera at the end is inserted into the nose or mouth. The tube is advanced to the lungs so your health care provider can view the lungs and obtain a culture, tissue sample, or remove an aspirated object.  A swallowing evaluation study may be performed to evaluate:  A person's risk of aspiration.  How difficult it is for a person to swallow.  What types of foods are safe for a person to eat. PREVENTION If you are a caregiver to someone who may aspirate, follow the directions below. If you are caring for someone who can eat and drink through their mouth:  Have them sit in an upright position when eating food or drinking fluids, such as:  Sitting up in a  chair.  If sitting in a chair is not possible, position the person in bed so they are upright.  Remind the person to eat slowly and chew well.  Do not distract the person. This is especially important for people with thinking or memory (cognitive) problems.  Check the person's mouth for leftover food after eating.  Keep the person sitting upright for 30 to 45 minutes after eating.  Do not serve food or drink for at least 2 hours before bedtime. If you are caring for someone with a feeding tube and he or she cannot eat or drink through their mouth:  Keep the person in an upright position as much as possible.  Do not  lay the person flat if they are getting continuous feedings. Turn the feeding pump off if you need to lay the person flat for any reason.  Check feeding tube residuals as directed by your health care provider. If a large amount of tube feedings are pulled back (aspirated) from the feeding tube, call your health care provider right away. General guidelines to prevent aspiration include:  Feed  small amounts of food. Do not force feed.  Use as little water as possible when brushing the person's teeth or cleaning his or her mouth.  Provide oral care before and after meals.  Never put food or fluids in the mouth of a person who is not fully alert.  Crush pills and put them in soft food such as pudding or ice cream. Some pills should not be crushed. Check with your health care provider before crushing any medication. SEEK IMMEDIATE MEDICAL CARE IF:   The person has trouble breathing or starts to breathe rapidly.  The person is breathing very slowly or stops breathing.  The person coughs a lot after eating or drinking.  The person has a chronic cough.  The person coughs up thick, yellow, or tan sputum.  The person has a fever or persistent symptoms for more than 72 hours.  The person has a fever and their symptoms suddenly get worse. Document Released: 01/25/2010  Document Revised: 12/28/2012 Document Reviewed: 03/30/2013 [facility] Patient Information 2015 [place], [place]. This information is not intended to replace advice given to you by your health care provider. Make sure you discuss any questions you have with your health care provider.    Dysphagia Level 1 Diet, Pureed The dysphasia level 1 diet includes foods that are completely pureed and smooth. The foods have a pudding-like texture, such as the texture of pureed pancakes, mashed potatoes, and yogurt. The diet does not include foods with lumps or coarse textures. Liquids should be smooth and may either be thin, nectar-thick, honey-like, or spoon-thick. This diet is helpful for people with moderate to severe swallowing problems. It reduces the risk of food getting caught in the windpipe, trachea, or lungs. You may need help or supervision during meals while following this diet. WHAT DO I NEED TO KNOW ABOUT THIS DIET? Foods  You may eat foods that are soft and have a pudding-like texture. If a food does not have this texture, you may be able to eat the food after:  Pureeing it. This can be done with a blender or whisk.  Moistening it with liquid. For example, you may have bread if you soak it in milk or syrup.  Avoid foods that are hard, dry, sticky, chunky, lumpy, or stringy. Also avoid foods with nuts, seeds, raisins, skins, and pulp.  Do not eat foods that you have to chew. If you have to chew the food, then you cannot eat it.  Eat a variety of foods to get all the nutrients you need. Liquids  You may drink liquids that are smooth. Your health care provider will tell you if you should drink thin or thickened liquids.  To thicken a liquid, use a food and beverage thickener or a thickening food. Thickened liquids are usually a "pudding-like" consistency.  Thin liquids include fruit juices, milk, coffee, tea, yogurts, shakes, and similar foods that melt to thin liquid at room  temperature.  Avoid liquids with seeds, pulp, or chunks. See your dietitian or health care provider regularly for help with your dietary changes. WHAT FOODS CAN I EAT? Grains Store-bought soft breads, pancakes, and [place] toast that have a smooth, moist texture and do not have nuts or seeds (you will need to moisten the food with liquid). Cooked cereals that have a pudding-like consistency, such as cream of wheat or farina (no oatmeal). Pureed, well-cooked pasta, rice, and plain bread stuffing. Vegetables Pureed vegetables. Soft avocado. Smooth tomato paste or sauce. Strained or pureed soups (these  may need to be thickened as directed). Mashed or pureed potatoes without skin (can be seasoned with butter, smooth gravy, margarine, or sour cream). Fruits Pureed fruits such as melons and apples without seeds or pulp. Mashed bananas. Smooth tomato paste or sauce. Fruit juices without pulp or seeds. Strained or pureed soups. Meat and Other Protein Sources Pureed meat. Smooth pate or liverwurst. Smooth souffles. Pureed beans (such as lentils). Pureed eggs. Dairy Yogurt. Smooth cheese sauces. Milk (may need to be thickened). Nutritional dairy drinks or shakes. Ask your health care provider whether you can have ice cream. Condiments Finely ground salt, pepper, and other ground spices. Sweets/Desserts Smooth puddings and custards. Pureed desserts. Souffles. Whipped topping. Ask your health care provider whether you can have frozen desserts. Fats and Oils Butter. Margarine. Smooth and strained gravy. Sour cream. Mayonnaise. Cream cheese. Whipped topping. Smooth sauces (such as white sauce, cheese sauce, or hol[place]aise sauce). The items listed above may not be a complete list of recommended foods or beverages. Contact your dietitian for more options. WHAT FOODS ARE NOT RECOMMENDED? Grains Oatmeal. Dry cereals. Hard breads. Vegetables Whole vegetables. Stringy vegetables (such as celery). Thin tomato  sauce. Fruits Whole fresh, frozen, canned, or dried fruits that have not been pureed. Stringy fruits (such as pineapple). Meat and Other Protein Sources Whole or ground meat, fish, or poultry. Dried or cooked lentils or legumes that have been cooked but not mashed or pureed. Non-pureed eggs. Nuts and seeds. Peanut butter. Dairy Non-pureed cheese. Dairy products with lumps or chunks. Ask your health care provider whether you can have ice cream. Condiments Coarse or seeded herbs and spices. Sweets/Desserts [place] preserves. Jams with seeds. Solid desserts. Sticky, chewy sweets (such as licorice and caramel). Ask your health care provider whether you can have frozen desserts. Fats and Oils Sauces of fats with lumps or chunks. The items listed above may not be a complete list of foods and beverages to avoid. Contact your dietitian for more information. Document Released: 12/23/2004 Document Revised: 05/09/2013 Document Reviewed: 12/06/2012 [facility] Patient Information 2015 [place], [place]. This information is not intended to replace advice given to you by your health care provider. Make sure you discuss any questions you have with your health care provider.

## 2014-04-26 NOTE — ED Notes (Signed)
Pt given PO fluids.

## 2014-04-26 NOTE — ED Notes (Signed)
Bed: WA01 Expected date:  Expected time:  Means of arrival:  Comments: tx from endo

## 2014-04-27 ENCOUNTER — Encounter: Payer: Self-pay | Admitting: Internal Medicine

## 2014-04-27 ENCOUNTER — Telehealth: Payer: Self-pay | Admitting: Internal Medicine

## 2014-04-27 ENCOUNTER — Ambulatory Visit (INDEPENDENT_AMBULATORY_CARE_PROVIDER_SITE_OTHER)
Admission: RE | Admit: 2014-04-27 | Discharge: 2014-04-27 | Disposition: A | Discharge status: Home / Self Care | Payer: BLUE CROSS/BLUE SHIELD | Source: Ambulatory Visit | Attending: Internal Medicine | Admitting: Internal Medicine

## 2014-04-27 ENCOUNTER — Ambulatory Visit (INDEPENDENT_AMBULATORY_CARE_PROVIDER_SITE_OTHER): Payer: BLUE CROSS/BLUE SHIELD | Admitting: Internal Medicine

## 2014-04-27 VITALS — BP 128/82 | HR 79 | Temp 98.7°F | Ht 75.0 in | Wt 196.8 lb

## 2014-04-27 DIAGNOSIS — R0600 Dyspnea, unspecified: Secondary | ICD-10-CM

## 2014-04-27 DIAGNOSIS — T18128D Food in esophagus causing other injury, subsequent encounter: Secondary | ICD-10-CM | POA: Diagnosis not present

## 2014-04-27 DIAGNOSIS — Z23 Encounter for immunization: Secondary | ICD-10-CM | POA: Diagnosis not present

## 2014-04-27 NOTE — Telephone Encounter (Signed)
Call and recommendations from last evening reviewed. Agree

## 2014-04-27 NOTE — Telephone Encounter (Signed)
Spoke with Caleb Jones. He will see Dr Linna Darner at 2:30 pm today for his symptoms. He wanted to thank Vaughan Basta and Dr Henrene Pastor.

## 2014-04-27 NOTE — Progress Notes (Signed)
Pre visit review using our clinic review tool, if applicable. No additional management support is needed unless otherwise documented below in the visit note. 

## 2014-04-27 NOTE — Patient Instructions (Signed)
Please  Use Incentive Spirometry every 2 hours while awake  to enhance inflation of the lungs and prevent atelectasis as we discussed.

## 2014-04-27 NOTE — Progress Notes (Signed)
   Subjective:    Patient ID: Caleb Jones, male    DOB: [birth year], 64 y.o.   MRN: [id number]  HPI He is here because of temperature elevation up to 100.4 last night. This is in the context of having a food impaction removed endoscopically 04/25/14 at [facility]. There was a linear tear of the esophagus. He's remained on clear liquids; he's also on Levaquin .The temp did respond to 500 mg of Tylenol, 2 tablets.  He's had exertional dyspnea as well as dyspnea in the supine position. He describes "tightness" in the sternal area. This was more pronounced prior to the endoscopic procedure. He also describes bloating and the necessity of taking "short breaths".   Review of Systems     Objective:   Physical Exam  General appearance:Adequately nourished; no acute distress or increased work of breathing is present.    Lymphatic: No  lymphadenopathy about the head, neck, or axilla .  Eyes: No conjunctival inflammation or lid edema is present. There is no scleral icterus.  Ears:  External ear exam shows no significant lesions or deformities.  Otoscopic examination reveals clear canals, tympanic membranes are intact bilaterally without bulging, retraction, inflammation or discharge.  Nose:  External nasal examination shows no deformity or inflammation. Nasal mucosa are pink and moist without lesions or exudates No septal dislocation or deviation.No obstruction to airflow.   Oral exam: Dental hygiene is good; lips and gums are healthy appearing.There is no oropharyngeal erythema or exudate .  Neck:  No deformities, thyromegaly, masses, or tenderness noted.   Supple with full range of motion without pain.   Heart:  Normal rate and regular rhythm. S1 and S2 normal without gallop, murmur, click, rub or other extra sounds.   Lungs:Chest clear to auscultation; no wheezes, rhonchi,rales ,or rubs present.  Extremities:  No cyanosis, edema, or clubbing  noted    Skin: Warm & dry w/o  tenting or jaundice. No significant lesions or rash.       Assessment & Plan:  #1 dyspnea S/P food impaction. R/O aspiration PNA

## 2014-05-02 ENCOUNTER — Telehealth: Payer: Self-pay | Admitting: Internal Medicine

## 2014-05-02 NOTE — Telephone Encounter (Signed)
Pt had EGD with Dr. Henrene Pastor in the hospital last Tuesday. States he is following the liquids, soft foods diet like he is supposed to. Pt reports that he has continued to run low grade temp. Sun and Mon evening temp was 100.4. Pt has gone to work but only able to work about 3 hours and he is very weak and exhausted. Pt concerned and wants to know if he needs to do anything different. Please advise.

## 2014-05-02 NOTE — Telephone Encounter (Signed)
Spoke with pt and he is aware and will contact Dr. Linna Darner regarding issues per Dr. Henrene Pastor.

## 2014-05-02 NOTE — Telephone Encounter (Signed)
From a GI standpoint, his symptoms should not be referable. He did have aspiration pneumonitis related to his food impaction for which he was given antibiotics and saw Dr. Linna Darner. For weakness, exhaustion, and low-grade temperature, I recommend that he  return to Dr. Linna Darner. From my standpoint, continue on omeprazole daily and liquids/soft diet only. Office follow-up with me in 3 weeks or so as previously recommended. Thank him for checking in with [place].

## 2014-05-17 ENCOUNTER — Ambulatory Visit (INDEPENDENT_AMBULATORY_CARE_PROVIDER_SITE_OTHER): Payer: BLUE CROSS/BLUE SHIELD | Admitting: Internal Medicine

## 2014-05-17 ENCOUNTER — Encounter: Payer: Self-pay | Admitting: Internal Medicine

## 2014-05-17 VITALS — BP 110/80 | HR 60 | Ht 75.0 in | Wt 193.2 lb

## 2014-05-17 DIAGNOSIS — K219 Gastro-esophageal reflux disease without esophagitis: Secondary | ICD-10-CM | POA: Diagnosis not present

## 2014-05-17 DIAGNOSIS — J690 Pneumonitis due to inhalation of food and vomit: Secondary | ICD-10-CM | POA: Diagnosis not present

## 2014-05-17 DIAGNOSIS — K222 Esophageal obstruction: Secondary | ICD-10-CM

## 2014-05-17 MED ORDER — OMEPRAZOLE 20 MG PO CPDR: 20.0000 mg | DELAYED_RELEASE_CAPSULE | Freq: Every day | ORAL | Status: DC

## 2014-05-17 NOTE — Progress Notes (Signed)
HISTORY OF PRESENT ILLNESS:  Caleb Jones is a 64 y.o. male with a history of prostate cancer and anxiety. He presents today for follow-up after being seen in the hospital, as an emergency, for an acute food impaction 04/25/2014. The patient is accompanied by his girlfriend. The food impaction was quite severe with food impacted in one half of the esophagus and secretions above. She tells me that he probably had food impacted several days prior to presenting to the hospital. In any event, he also had impaction associated laceration of the esophagus. Esophagram revealed the laceration without perforation. He did develop aspiration pneumonitis and was treated with antibiotics. He has since been followed up with his primary care provider, Dr. Linna Jones. He has been taking omeprazole 20 mg daily. Also being very careful with his diet. Does have some mild residual shortness of breath for which Dr. Linna Jones recommended an inhaler. Previous colonoscopy here with Dr. Verl Jones May 2010 was normal with internal hemorrhoids. Follow-up in 10 years recommended.  REVIEW OF SYSTEMS:  All non-GI ROS negative except for anxiety, fatigue, shortness of breath  Past Medical History  Diagnosis Date  . History of prostate cancer 11/20/2008    Dr Caleb Jones  . Pneumonia 01/2009    pleural effusion tapped  . Anxiety     Past Surgical History  Procedure Laterality Date  . Refractive surgery  2007    for Retina Dethachment, Dr Caleb Jones , [facility]  . Tonsillectomy    . Prostatectomy  11/20/2008    Dr Caleb Jones  . Colonoscopy  04/2008    Dr Caleb Jones  . Esophagogastroduodenoscopy N/A 04/25/2014    Procedure: ESOPHAGOGASTRODUODENOSCOPY (EGD);  Surgeon: Caleb Shipper, MD;  Location: [facility];  Service: Endoscopy;  Laterality: N/A;    Social History Caleb Jones  reports that he has never smoked. He has never used smokeless tobacco. He reports that he drinks about 0.6 oz of alcohol per week. He reports that he does  not use illicit drugs.  family history includes Breast cancer in his sister; Dementia in his mother; Diabetes in his maternal aunt; Hypertension in his mother; Prostate cancer (age of onset: 39) in his father; Transient ischemic attack in his mother. There is no history of Heart disease.  No Known Allergies     PHYSICAL EXAMINATION: Vital signs: BP 110/80 mmHg  Pulse 60  Ht 6\' 3"  (1.905 m)  Wt 193 lb 3.2 oz (87.635 kg)  BMI 24.15 kg/m2 General: Well-developed, well-nourished, no acute distress HEENT: Sclerae are anicteric, conjunctiva pink. Oral mucosa intact Lungs: Clear to auscultation and percussion Heart: Regular without murmur Abdomen: soft, nontender, nondistended, no obvious ascites, no peritoneal signs, normal bowel sounds. No organomegaly. Extremities: No edema Psychiatric: alert and oriented x3. Cooperative   ASSESSMENT:  #1. GERD complicated by peptic stricture #2. Severe food impaction requiring endoscopic removal as described #3. Contained food impaction related laceration of the esophagus #4. Food impaction related aspiration pneumonitis. Treated by Dr. Linna Jones #5. Colonoscopy 2010 normal.   PLAN:  #1. Continue omeprazole 20 mg daily #2. Prescription for omeprazole 20 mg daily submitted electronically. Multiple refills #3. Schedule upper endoscopy with esophageal dilation.The nature of the procedure, as well as the risks, benefits, and alternatives were carefully and thoroughly reviewed with the patient. Ample time for discussion and questions allowed. The patient understood, was satisfied, and agreed to proceed. #4. Chew food carefully until endoscopy performed #5. Ongoing follow-up with Dr. Linna Jones as needed for respiratory complaints #6. Repeat screening colonoscopy around  2020 

## 2014-05-17 NOTE — Patient Instructions (Signed)
We have sent the following medications to your pharmacy for you to pick up at your convenience:  Omeprazole  You have been scheduled for an endoscopy. Please follow written instructions given to you at your visit today. If you use inhalers (even only as needed), please bring them with you on the day of your procedure.  

## 2014-05-22 ENCOUNTER — Encounter: Payer: Self-pay | Admitting: Internal Medicine

## 2014-06-09 ENCOUNTER — Encounter: Payer: Self-pay | Admitting: Internal Medicine

## 2014-06-09 ENCOUNTER — Ambulatory Visit (AMBULATORY_SURGERY_CENTER): Payer: BLUE CROSS/BLUE SHIELD | Admitting: Internal Medicine

## 2014-06-09 VITALS — BP 140/77 | HR 53 | Temp 98.4°F | Resp 17 | Ht 75.0 in | Wt 193.0 lb

## 2014-06-09 DIAGNOSIS — K222 Esophageal obstruction: Secondary | ICD-10-CM

## 2014-06-09 DIAGNOSIS — K219 Gastro-esophageal reflux disease without esophagitis: Secondary | ICD-10-CM

## 2014-06-09 MED ORDER — SODIUM CHLORIDE 0.9 % IV SOLN: 500.0000 mL | INTRAVENOUS | Status: DC

## 2014-06-09 NOTE — Patient Instructions (Signed)
YOU HAD AN ENDOSCOPIC PROCEDURE TODAY AT [facility]:   Refer to the procedure report that was given to you for any specific questions about what was found during the examination.  If the procedure report does not answer your questions, please call your gastroenterologist to clarify.  If you requested that your care partner not be given the details of your procedure findings, then the procedure report has been included in a sealed envelope for you to review at your convenience later.  YOU SHOULD EXPECT: Some feelings of bloating in the abdomen. Passage of more gas than usual.  Walking can help get rid of the air that was put into your GI tract during the procedure and reduce the bloating. If you had a lower endoscopy (such as a colonoscopy or flexible sigmoidoscopy) you may notice spotting of blood in your stool or on the toilet paper. If you underwent a bowel prep for your procedure, you may not have a normal bowel movement for a few days.  Please Note:  You might notice some irritation and congestion in your nose or some drainage.  This is from the oxygen used during your procedure.  There is no need for concern and it should clear up in a day or so.  SYMPTOMS TO REPORT IMMEDIATELY:    Following upper endoscopy (EGD)  Vomiting of blood or coffee ground material  New chest pain or pain under the shoulder blades  Painful or persistently difficult swallowing  New shortness of breath  Fever of 100F or higher  Black, tarry-looking stools  For urgent or emergent issues, a gastroenterologist can be reached at any hour by calling [phone].   DIET: Your first meal following the procedure should be a small meal and then it is ok to progress to your normal diet. Heavy or fried foods are harder to digest and may make you feel nauseous or bloated.  Likewise, meals heavy in dairy and vegetables can increase bloating.  Drink plenty of fluids but you should avoid alcoholic beverages  for 24 hours.  ACTIVITY:  You should plan to take it easy for the rest of today and you should NOT DRIVE or use heavy machinery until tomorrow (because of the sedation medicines used during the test).    FOLLOW UP: Our staff will call the number listed on your records the next business day following your procedure to check on you and address any questions or concerns that you may have regarding the information given to you following your procedure. If we do not reach you, we will leave a message.  However, if you are feeling well and you are not experiencing any problems, there is no need to return our call.  We will assume that you have returned to your regular daily activities without incident.  If any biopsies were taken you will be contacted by phone or by letter within the next 1-3 weeks.  Please call us at [phone] if you have not heard about the biopsies in 3 weeks.    SIGNATURES/CONFIDENTIALITY: You and/or your care partner have signed paperwork which will be entered into your electronic medical record.  These signatures attest to the fact that that the information above on your After Visit Summary has been reviewed and is understood.  Full responsibility of the confidentiality of this discharge information lies with you and/or your care-partner.   GERD, stricture information given.  Continue Proton Pump inhibitor    Upper endoscopy with dilation scheduled.

## 2014-06-09 NOTE — Op Note (Signed)
[place]  [address] [place], [address]   ENDOSCOPY PROCEDURE REPORT  PATIENT: Caleb Jones, Caleb Jones  MR#: [id number] BIRTHDATE: [birth year] , 69  yrs. old GENDER: male ENDOSCOPIST: Eustace Quail, MD REFERRED BY:  .  Self / Office PROCEDURE DATE:  06/09/2014 PROCEDURE:  EGD, diagnostic ASA CLASS:     Class II INDICATIONS:  therapeutic procedure. The patient had severe food impaction of the esophagus with food impaction associated laceration 04/25/2014. Has been on PPI and modified diet since. MEDICATIONS: Monitored anesthesia care and Propofol 200 mg IV TOPICAL ANESTHETIC: none  DESCRIPTION OF PROCEDURE: After the risks benefits and alternatives of the procedure were thoroughly explained, informed consent was obtained.  The LB [id number] endoscope was introduced through the mouth and advanced to the second portion of the duodenum , Without limitations.  The instrument was slowly withdrawn as the mucosa was fully examined.    EXAM:The esophagus revealed significantly healing laceration. However, still moderate laceration distally.  Peptic stricture present at the gastroesophageal junction.  Stomach was normal.  The duodenum was normal.  Retroflexed views revealed a hiatal hernia. The scope was then withdrawn from the patient and the procedure completed.  COMPLICATIONS: There were no immediate complications.  ENDOSCOPIC IMPRESSION: 1. Significant but incomplete healing of esophageal laceration from previous food impaction 2. Distal esophageal stricture. Elected not to dilate due to the presence of persistent laceration in that area 3. GERD  RECOMMENDATIONS: 1. Continue proton pump inhibitor therapy 2. Continue to chew food well 3. Plan Upper Endoscopy with esophageal dilation in 6-8 weeks.  REPEAT EXAM:  eSigned:  Eustace Quail, MD 06/09/2014 10:40 AM    CC:The Patient and Hendricks Limes, MD

## 2014-06-09 NOTE — Progress Notes (Signed)
Report to PACU, RN, vss, BBS= Clear.  

## 2014-06-12 ENCOUNTER — Telehealth: Payer: Self-pay | Admitting: *Deleted

## 2014-06-12 NOTE — Telephone Encounter (Signed)
No answer, left message to call if questions or concerns. 

## 2014-06-19 ENCOUNTER — Telehealth: Payer: Self-pay

## 2014-06-19 MED ORDER — OMEPRAZOLE 20 MG PO CPDR: 20.0000 mg | DELAYED_RELEASE_CAPSULE | Freq: Every day | ORAL | Status: DC

## 2014-06-19 NOTE — Telephone Encounter (Signed)
90 day supply of Omeprazole refilled as requested

## 2014-07-18 ENCOUNTER — Other Ambulatory Visit: Payer: Self-pay | Admitting: Internal Medicine

## 2014-07-18 NOTE — Telephone Encounter (Signed)
OK X1  My retirement date is 01/06/2015; but I will be in office on a limited schedule Oct-Dec. To guarantee continuity of care you should transition your care to another PCP by Oct 1,2016.     

## 2014-07-19 ENCOUNTER — Ambulatory Visit (AMBULATORY_SURGERY_CENTER): Payer: Self-pay | Admitting: *Deleted

## 2014-07-19 VITALS — Ht 75.0 in | Wt 193.0 lb

## 2014-07-19 DIAGNOSIS — K222 Esophageal obstruction: Secondary | ICD-10-CM

## 2014-07-19 NOTE — Progress Notes (Signed)
Patient denies any allergies to eggs or soy. Patient denies any problems with anesthesia/sedation. Patient denies any oxygen use at home and does not take any diet/weight loss medications.  

## 2014-08-02 ENCOUNTER — Encounter: Payer: BLUE CROSS/BLUE SHIELD | Admitting: Internal Medicine

## 2014-08-12 ENCOUNTER — Encounter: Payer: Self-pay | Admitting: Internal Medicine

## 2014-08-12 ENCOUNTER — Ambulatory Visit (INDEPENDENT_AMBULATORY_CARE_PROVIDER_SITE_OTHER): Payer: BLUE CROSS/BLUE SHIELD | Admitting: Internal Medicine

## 2014-08-12 VITALS — BP 124/82 | HR 61 | Temp 98.2°F | Wt 191.5 lb

## 2014-08-12 DIAGNOSIS — J029 Acute pharyngitis, unspecified: Secondary | ICD-10-CM

## 2014-08-12 DIAGNOSIS — J019 Acute sinusitis, unspecified: Secondary | ICD-10-CM | POA: Insufficient documentation

## 2014-08-12 DIAGNOSIS — J0100 Acute maxillary sinusitis, unspecified: Secondary | ICD-10-CM | POA: Insufficient documentation

## 2014-08-12 DIAGNOSIS — J0110 Acute frontal sinusitis, unspecified: Secondary | ICD-10-CM

## 2014-08-12 LAB — POCT RAPID STREP A (OFFICE): Rapid Strep A Screen: NEGATIVE

## 2014-08-12 MED ORDER — AMOXICILLIN-POT CLAVULANATE 875-125 MG PO TABS: 1.0000 | ORAL_TABLET | Freq: Two times a day (BID) | ORAL | Status: DC

## 2014-08-12 NOTE — Assessment & Plan Note (Signed)
Probably had viral infection last week but seemed to improve on amoxil Then relapsed Some better today Discussed symptomatic care augmentin if worsens

## 2014-08-12 NOTE — Progress Notes (Signed)
   Subjective:    Patient ID: Caleb Jones, male    DOB: [birth year], 64 y.o.   MRN: [id number]  HPI Here due to persistent respiratory symptoms  Had been on vacation at [facility] 7/20--had terrible sore throat Sinus congestion, etc [facility]--given amoxicillin and finished it a week ago Did feel like he had improved  Sore throat recurred though 2-3 days later Cold sweat last night and throughout the past week Still with sinus blockage--now draining Seems some better this AM  No significant cough--just hacking to clear secretions No ear pain  Has been using flonase since being in [place] Not usually prone to sinus infections No other meds  Current Outpatient Prescriptions on File Prior to Visit  Medication Sig Dispense Refill  . clonazePAM (KLONOPIN) 0.5 MG tablet TAKE 1 TABLET BY MOUTH EVERY DAY AT BEDTIME AS NEEDED 30 tablet 0  . omeprazole (PRILOSEC) 20 MG capsule Take 1 capsule (20 mg total) by mouth daily. 90 capsule 3   No current facility-administered medications on file prior to visit.    No Known Allergies  Past Medical History  Diagnosis Date  . History of prostate cancer 11/20/2008    Dr Alinda Money  . Pneumonia 01/2009    pleural effusion tapped  . Anxiety     Past Surgical History  Procedure Laterality Date  . Refractive surgery  2007    for Retina Dethachment, Dr Jalene Mullet , [facility]  . Tonsillectomy    . Prostatectomy  11/20/2008    Dr Dutch Gray  . Colonoscopy  04/2008    Dr Sharlett Iles  . Esophagogastroduodenoscopy N/A 04/25/2014    Procedure: ESOPHAGOGASTRODUODENOSCOPY (EGD);  Surgeon: Irene Shipper, MD;  Location: [facility];  Service: Endoscopy;  Laterality: N/A;    Family History  Problem Relation Age of Onset  . Dementia Mother   . Hypertension Mother   . Transient ischemic attack Mother     > 69  . Prostate cancer Father 41     metastatic to bones, lungs & cns  . Breast cancer Sister     <50  . Diabetes Maternal Aunt   .  Heart disease Neg Hx     History   Social History  . Marital Status: Single    Spouse Name: N/A  . Number of Children: N/A  . Years of Education: N/A   Occupational History  . Lobbyist    Social History Main Topics  . Smoking status: Never Smoker   . Smokeless tobacco: Never Used  . Alcohol Use: 0.6 oz/week    1 Cans of beer per week     Comment: Socially  . Drug Use: No  . Sexual Activity: Not on file   Other Topics Concern  . Not on file   Social History Narrative   Review of Systems  No rash No GI symptoms like N/V Appetite is off     Objective:   Physical Exam  Constitutional: He appears well-developed and well-nourished. No distress.  HENT:  Mouth/Throat: Oropharynx is clear and moist. No oropharyngeal exudate.  Mild frontal tenderness Mild nasal inflammation TMs normal   Neck: Normal range of motion. Neck supple. No thyromegaly present.  Pulmonary/Chest: Effort normal and breath sounds normal. No respiratory distress. He has no wheezes. He has no rales.  Lymphadenopathy:    He has no cervical adenopathy.          Assessment & Plan:

## 2014-08-12 NOTE — Progress Notes (Signed)
Pre visit review using our clinic review tool, if applicable. No additional management support is needed unless otherwise documented below in the visit note. 

## 2014-08-12 NOTE — Patient Instructions (Signed)
Please start the antibiotic if you are worsening.

## 2014-08-15 ENCOUNTER — Encounter: Payer: Self-pay | Admitting: Internal Medicine

## 2014-08-15 ENCOUNTER — Ambulatory Visit (INDEPENDENT_AMBULATORY_CARE_PROVIDER_SITE_OTHER): Payer: BLUE CROSS/BLUE SHIELD | Admitting: Internal Medicine

## 2014-08-15 VITALS — BP 110/78 | HR 50 | Temp 98.7°F | Ht 75.0 in | Wt 194.0 lb

## 2014-08-15 DIAGNOSIS — Z23 Encounter for immunization: Secondary | ICD-10-CM | POA: Diagnosis not present

## 2014-08-15 DIAGNOSIS — W540XXA Bitten by dog, initial encounter: Secondary | ICD-10-CM | POA: Diagnosis not present

## 2014-08-15 DIAGNOSIS — T148 Other injury of unspecified body region: Secondary | ICD-10-CM

## 2014-08-15 NOTE — Progress Notes (Signed)
Pre visit review using our clinic review tool, if applicable. No additional management support is needed unless otherwise documented below in the visit note. 

## 2014-08-15 NOTE — Assessment & Plan Note (Signed)
New onset x hrs earlier today, for tetanus today, has some swelling but no overt sign infection, incidentally has augmentin at home minus 1 pill, urged pt to take to tx prior infection, as well as tx or prevent dog bite site infections as well, of which onset hand cellulitis or abscess may result in hosptilization

## 2014-08-15 NOTE — Progress Notes (Signed)
   Subjective:    Patient ID: Caleb Jones, male    DOB: June 16, 1950, 64 y.o.   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 Incidentally has augmentin at home he has only taken 1 pill after seen recently for upper resp infection. Past Medical History  Diagnosis Date  . History of prostate cancer 11/20/2008    Dr Alinda Money  . Pneumonia 01/2009    pleural effusion tapped  . Anxiety    Past Surgical History  Procedure Laterality Date  . Refractive surgery  2007    for Retina Dethachment, Dr Jalene Mullet , [facility]  . Tonsillectomy    . Prostatectomy  11/20/2008    Dr Dutch Gray  . Colonoscopy  04/2008    Dr Sharlett Iles  . Esophagogastroduodenoscopy N/A 04/25/2014    Procedure: ESOPHAGOGASTRODUODENOSCOPY (EGD);  Surgeon: Irene Shipper, MD;  Location: [facility];  Service: Endoscopy;  Laterality: N/A;    reports that he has never smoked. He has never used smokeless tobacco. He reports that he drinks about 0.6 oz of alcohol per week. He reports that he does not use illicit drugs. family history includes Breast cancer in his sister; Dementia in his mother; Diabetes in his maternal aunt; Hypertension in his mother; Prostate cancer (age of onset: 2) in his father; Transient ischemic attack in his mother. There is no history of Heart disease. No Known Allergies Current Outpatient Prescriptions on File Prior to Visit  Medication Sig Dispense Refill  . amoxicillin-clavulanate (AUGMENTIN) 875-125 MG per tablet Take 1 tablet by mouth 2 (two) times daily. 20 tablet 0  . clonazePAM (KLONOPIN) 0.5 MG tablet TAKE 1 TABLET BY MOUTH EVERY DAY AT BEDTIME AS NEEDED 30 tablet 0  . omeprazole (PRILOSEC) 20 MG  capsule Take 1 capsule (20 mg total) by mouth daily. 90 capsule 3   No current facility-administered medications on file prior to visit.   Review of Systems All otherwise neg per pt     Objective:   Physical Exam BP 110/78 mmHg  Pulse 50  Temp(Src) 98.7 F (37.1 C) (Oral)  Ht 6\' 3"  (1.905 m)  Wt 194 lb (87.998 kg)  BMI 24.25 kg/m2  SpO2 98% VS noted,  Constitutional: Pt appears in no significant distress HENT: Head: NCAT.  Right Ear: External ear normal.  Left Ear: External ear normal.  Eyes: . Pupils are equal, round, and reactive to light. Conjunctivae and EOM are normal Neck: Normal range of motion. Neck supple.  Cardiovascular: Normal rate and regular rhythm.   Pulmonary/Chest: Effort normal and breath sounds without rales or wheezing.  Neurological: Pt is alert. Not confused , motor grossly intact Skin: Skin is warm. No rash, no LE edema, right hand with 2 puncture wounds near first and second MCP;s, left hand with 2 punctures at thenar eminence. Psychiatric: Pt behavior is normal. No agitation.     Assessment & Plan:

## 2014-08-15 NOTE — Patient Instructions (Signed)
You had the Tetanus shot today  Please continue all other medications as before, including the antibiotic you already have  Please have the pharmacy call with any other refills you may need.  Please keep your appointments with your specialists as you may have planned

## 2014-09-18 ENCOUNTER — Other Ambulatory Visit: Payer: Self-pay | Admitting: Internal Medicine

## 2014-09-19 ENCOUNTER — Other Ambulatory Visit: Payer: Self-pay | Admitting: Emergency Medicine

## 2014-09-19 MED ORDER — CLONAZEPAM 0.5 MG PO TABS: ORAL_TABLET | ORAL | Status: DC

## 2014-09-19 NOTE — Telephone Encounter (Signed)
Klonopin faxed to pharm  

## 2014-09-19 NOTE — Telephone Encounter (Signed)
OK x 1 

## 2014-09-27 ENCOUNTER — Ambulatory Visit (AMBULATORY_SURGERY_CENTER): Payer: BLUE CROSS/BLUE SHIELD | Admitting: Internal Medicine

## 2014-09-27 ENCOUNTER — Encounter: Payer: Self-pay | Admitting: Internal Medicine

## 2014-09-27 VITALS — BP 121/76 | HR 72 | Temp 97.6°F | Resp 24 | Ht 75.0 in | Wt 194.0 lb

## 2014-09-27 DIAGNOSIS — R1310 Dysphagia, unspecified: Secondary | ICD-10-CM

## 2014-09-27 DIAGNOSIS — K222 Esophageal obstruction: Secondary | ICD-10-CM | POA: Diagnosis not present

## 2014-09-27 MED ORDER — SODIUM CHLORIDE 0.9 % IV SOLN: 500.0000 mL | INTRAVENOUS | Status: DC

## 2014-09-27 NOTE — Progress Notes (Signed)
Transferred to recovery room. A/O x3, pleased with MAC.  VSS.  Report to Annette, RN. 

## 2014-09-27 NOTE — Patient Instructions (Addendum)
YOU HAD AN ENDOSCOPIC PROCEDURE TODAY AT [facility]:   Refer to the procedure report that was given to you for any specific questions about what was found during the examination.  If the procedure report does not answer your questions, please call your gastroenterologist to clarify.  If you requested that your care partner not be given the details of your procedure findings, then the procedure report has been included in a sealed envelope for you to review at your convenience later.  YOU SHOULD EXPECT: Some feelings of bloating in the abdomen. Passage of more gas than usual.  Walking can help get rid of the air that was put into your GI tract during the procedure and reduce the bloating. If you had a lower endoscopy (such as a colonoscopy or flexible sigmoidoscopy) you may notice spotting of blood in your stool or on the toilet paper. If you underwent a bowel prep for your procedure, you may not have a normal bowel movement for a few days.  Please Note:  You might notice some irritation and congestion in your nose or some drainage.  This is from the oxygen used during your procedure.  There is no need for concern and it should clear up in a day or so.  SYMPTOMS TO REPORT IMMEDIATELY:    Following upper endoscopy (EGD)  Vomiting of blood or coffee ground material  New chest pain or pain under the shoulder blades  Painful or persistently difficult swallowing  New shortness of breath  Fever of 100F or higher  Black, tarry-looking stools  For urgent or emergent issues, a gastroenterologist can be reached at any hour by calling [phone].   DIET: Pleas follow the dilatation diet the rest of today.   Drink plenty of fluids but you should avoid alcoholic beverages for 24 hours.  ACTIVITY:  You should plan to take it easy for the rest of today and you should NOT DRIVE or use heavy machinery until tomorrow (because of the sedation medicines used during the test).    FOLLOW  UP: Our staff will call the number listed on your records the next business day following your procedure to check on you and address any questions or concerns that you may have regarding the information given to you following your procedure. If we do not reach you, we will leave a message.  However, if you are feeling well and you are not experiencing any problems, there is no need to return our call.  We will assume that you have returned to your regular daily activities without incident.  If any biopsies were taken you will be contacted by phone or by letter within the next 1-3 weeks.  Please call us at [phone] if you have not heard about the biopsies in 3 weeks.    SIGNATURES/CONFIDENTIALITY: You and/or your care partner have signed paperwork which will be entered into your electronic medical record.  These signatures attest to the fact that that the information above on your After Visit Summary has been reviewed and is understood.  Full responsibility of the confidentiality of this discharge information lies with you and/or your care-partner.   Handouts were given to your care partner on GERD and the dilatation diet to follow the rest of the day. You may resume your current medications today.  Continue taking PPI, omeprazole. Please call if any questions or concerns. Office visit with Dr. Henrene Pastor in 1 year.  Contact the office in the interim should recurrent swallowing  problems occur.

## 2014-09-27 NOTE — Progress Notes (Signed)
Called to room to assist during endoscopic procedure.  Patient ID and intended procedure confirmed with present staff. Received instructions for my participation in the procedure from the performing physician.  

## 2014-09-27 NOTE — Op Note (Signed)
[place]  [address]. [place], [address]   ENDOSCOPY PROCEDURE REPORT  PATIENT: Caleb, Jones  MR#: [id number] BIRTHDATE: Feb 21, 1950 , 32  yrs. old GENDER: male ENDOSCOPIST: Eustace Quail, MD REFERRED BY:  .Direct Self PROCEDURE DATE:  09/27/2014 PROCEDURE:  EGD w/ balloon dilation   -18, 19 mm ASA CLASS:     Class II INDICATIONS:  therapeutic procedure and dysphagia. MEDICATIONS: Monitored anesthesia care and Propofol 300 mg IV TOPICAL ANESTHETIC: none  DESCRIPTION OF PROCEDURE: After the risks benefits and alternatives of the procedure were thoroughly explained, informed consent was obtained.  The LB [id number] endoscope was introduced through the mouth and advanced to the second portion of the duodenum , Without limitations.  The instrument was slowly withdrawn as the mucosa was fully examined.  EXAM:The esophagus revealed a healed laceration with residual scarring.  At 39 cm from the incisors was a ringlike stricture measuring 15 mm.  Stomach was normal.  Duodenum was normal. Retroflexed views revealed a hiatal hernia. THERAPY: A 54 French Maloney dilator was attempted but would not pass the upper esophageal sphincter easily. Thus, a sequential TTS balloon was used and the stricture was dilated to 18, then 19 mm. Trace heme only. Patient tolerated procedure well    The scope was then withdrawn from the patient and the procedure completed.  COMPLICATIONS: There were no immediate complications.  ENDOSCOPIC IMPRESSION: 1. Distal esophageal stricture status post balloon dilation to 19 mm 2. GERDal  RECOMMENDATIONS: 1.  Clear liquids until 1 PM, then soft foods rest of day.  Resume prior diet tomorrow. 2.  Continue PPI 3. Office follow-up with Dr. Henrene Pastor in 1 year. Contact the office in the interim should recurrent swallowing problems occur  REPEAT EXAM:  eSigned:  Eustace Quail, MD 09/27/2014 10:56 AM    CC:The Patient and Hendricks Limes, MD

## 2014-09-27 NOTE — Progress Notes (Signed)
No problems noted in the recovery room. maw 

## 2014-09-28 ENCOUNTER — Telehealth: Payer: Self-pay | Admitting: Emergency Medicine

## 2014-09-28 NOTE — Telephone Encounter (Signed)
  Follow up Call-  Call back number 09/27/2014 06/09/2014  Post procedure Call Back phone  # [phone]  Permission to leave phone message Yes Yes     Patient questions:  Do you have a fever, pain , or abdominal swelling? No. Pain Score  0 *  Have you tolerated food without any problems? Yes.    Have you been able to return to your normal activities? Yes.    Do you have any questions about your discharge instructions: Diet   No. Medications  No. Follow up visit  No.  Do you have questions or concerns about your Care? No.  Actions: * If pain score is 4 or above: No action needed, pain <4.

## 2014-11-04 ENCOUNTER — Other Ambulatory Visit: Payer: Self-pay | Admitting: Internal Medicine

## 2014-11-06 ENCOUNTER — Other Ambulatory Visit: Payer: Self-pay | Admitting: Emergency Medicine

## 2014-11-06 MED ORDER — CLONAZEPAM 0.5 MG PO TABS: ORAL_TABLET | ORAL | Status: DC

## 2014-12-12 ENCOUNTER — Encounter: Payer: Self-pay | Admitting: Gastroenterology

## 2014-12-21 ENCOUNTER — Telehealth: Payer: Self-pay

## 2014-12-21 NOTE — Telephone Encounter (Signed)
Pt had flu vaccine late sept at [facility]

## 2014-12-21 NOTE — Telephone Encounter (Signed)
Left Voice Mail for pt to call back.   RE: Flu Vaccine for 2016  

## 2014-12-25 ENCOUNTER — Encounter: Payer: Self-pay | Admitting: Internal Medicine

## 2014-12-25 ENCOUNTER — Ambulatory Visit (INDEPENDENT_AMBULATORY_CARE_PROVIDER_SITE_OTHER): Payer: BLUE CROSS/BLUE SHIELD | Admitting: Internal Medicine

## 2014-12-25 VITALS — BP 128/84 | HR 67 | Temp 98.6°F | Resp 16 | Wt 189.0 lb

## 2014-12-25 DIAGNOSIS — G479 Sleep disorder, unspecified: Secondary | ICD-10-CM

## 2014-12-25 DIAGNOSIS — K222 Esophageal obstruction: Secondary | ICD-10-CM

## 2014-12-25 DIAGNOSIS — F419 Anxiety disorder, unspecified: Secondary | ICD-10-CM | POA: Diagnosis not present

## 2014-12-25 MED ORDER — CLONAZEPAM 0.5 MG PO TABS: ORAL_TABLET | ORAL | Status: DC

## 2014-12-25 NOTE — Assessment & Plan Note (Signed)
GERD controlled with omeprazole 20 mg daily Continue daily medication

## 2014-12-25 NOTE — Assessment & Plan Note (Signed)
Related to anxiety Controlled currently with clonazepam nightly Discussed potential side effects of taking medication long-term Like to get off medication, but needs to start exercising regularly, which has helped in the past We'll continue nightly clonazepam for now-we'll refill today

## 2014-12-25 NOTE — Progress Notes (Signed)
Pre visit review using our clinic review tool, if applicable. No additional management support is needed unless otherwise documented below in the visit note. 

## 2014-12-25 NOTE — Patient Instructions (Addendum)
  We have reviewed your prior records including labs and tests today.   All other Health Maintenance issues reviewed.   All recommended immunizations and age-appropriate screenings are up-to-date.  No immunizations administered today.   Medications reviewed and updated.   No changes recommended at this time.  Your prescription(s) have been submitted to your pharmacy. Please take as directed and contact our office if you believe you are having problem(s) with the medication(s).

## 2014-12-25 NOTE — Progress Notes (Signed)
Subjective:    Patient ID: Caleb Jones, male    DOB: [birth year], 64 y.o.   MRN: [id number]  HPI He is here to establish with a new pcp.  He is here for follow up.    Anxiety, sleep disorder: His anxiety affects his sleep and he has been taking clonazepam nightly for at least 4 years.  He denies side effects and medication works well.  He would like to get off of the medication at some point, but he has tried this in the past was unsuccessful.  GERD:  He is taking his medication daily as prescribed.  He denies any GERD symptoms and feels his GERD is well controlled. He had an esophageal stricture been seen GI.   Medications and allergies reviewed with patient and updated if appropriate.  Patient Active Problem List   Diagnosis Date Noted  . Dog bite 08/15/2014  . Food impaction of esophagus 04/25/2014  . Esophageal stricture 04/25/2014  . Sleep disorder 12/21/2013  . Anxiety state, unspecified 01/22/2009  . PROSTATE CANCER, HX OF 01/11/2009    Current Outpatient Prescriptions on File Prior to Visit  Medication Sig Dispense Refill  . clonazePAM (KLONOPIN) 0.5 MG tablet TAKE 1 TABLET BY MOUTH EVERY DAY AT BEDTIME AS NEEDED--- Pt should establish with new PCP 30 tablet 0  . omeprazole (PRILOSEC) 20 MG capsule Take 1 capsule (20 mg total) by mouth daily. 90 capsule 3   No current facility-administered medications on file prior to visit.    Past Medical History  Diagnosis Date  . History of prostate cancer 11/20/2008    Dr Alinda Money  . Pneumonia 01/2009    pleural effusion tapped  . Anxiety     Past Surgical History  Procedure Laterality Date  . Refractive surgery  2007    for Retina Dethachment, Dr Jalene Mullet , [facility]  . Tonsillectomy    . Prostatectomy  11/20/2008    Dr Dutch Gray  . Colonoscopy  04/2008    Dr Sharlett Iles  . Esophagogastroduodenoscopy N/A 04/25/2014    Procedure: ESOPHAGOGASTRODUODENOSCOPY (EGD);  Surgeon: Irene Shipper, MD;  Location: [facility];   Service: Endoscopy;  Laterality: N/A;    Social History   Social History  . Marital Status: Single    Spouse Name: N/A  . Number of Children: N/A  . Years of Education: N/A   Occupational History  . Lobbyist    Social History Main Topics  . Smoking status: Never Smoker   . Smokeless tobacco: Never Used  . Alcohol Use: 0.6 oz/week    1 Cans of beer per week     Comment: Socially  . Drug Use: No  . Sexual Activity: Not on file   Other Topics Concern  . Not on file   Social History Narrative    Review of Systems  Constitutional: Negative for fever and chills.  Respiratory: Negative for cough, shortness of breath and wheezing.   Cardiovascular: Positive for palpitations (anxiety related). Negative for chest pain.  Gastrointestinal: Negative for abdominal pain.  Neurological: Positive for headaches. Negative for dizziness and light-headedness.  Psychiatric/Behavioral: Positive for sleep disturbance (related to anxiety). The patient is nervous/anxious.        Objective:   Filed Vitals:   12/25/14 1346  BP: 128/84  Pulse: 67  Temp: 98.6 F (37 C)  Resp: 16   Filed Weights   12/25/14 1346  Weight: 189 lb (85.73 kg)   Body mass index is 23.62 kg/(m^2).   Physical  Exam  Constitutional: He appears well-developed and well-nourished. No distress.  Neck: Neck supple. No tracheal deviation present. No thyromegaly present.  No carotid bruit  Cardiovascular: Normal rate, regular rhythm and normal heart sounds.   No murmur heard. Pulmonary/Chest: Effort normal and breath sounds normal. No respiratory distress. He has no wheezes. He has no rales.  Musculoskeletal: He exhibits no edema.  Lymphadenopathy:    He has no cervical adenopathy.  Psychiatric: He has a normal mood and affect.         Assessment & Plan:   See Problem List.  Follow up for PE next year

## 2014-12-25 NOTE — Assessment & Plan Note (Signed)
Anxiety affects his sleep-taking clonazepam nightly He has been on medication for years and has not been able to get off recently He helped to start regular exercise, which may allow him to get off the medication. He knows he needs to taper off slowly Continue clonazepam nightly

## 2015-03-05 ENCOUNTER — Other Ambulatory Visit: Payer: Self-pay | Admitting: Internal Medicine

## 2015-03-05 NOTE — Telephone Encounter (Signed)
rx printed

## 2015-03-05 NOTE — Telephone Encounter (Signed)
Last OV and Refill 12/16, please advise

## 2015-04-26 ENCOUNTER — Other Ambulatory Visit: Payer: Self-pay | Admitting: Internal Medicine

## 2015-04-26 NOTE — Telephone Encounter (Signed)
Refill faxed to [facility] 

## 2015-06-18 ENCOUNTER — Telehealth: Payer: Self-pay | Admitting: Internal Medicine

## 2015-06-18 MED ORDER — CLONAZEPAM 0.5 MG PO TABS: 0.5000 mg | ORAL_TABLET | Freq: Every evening | ORAL | Status: DC | PRN

## 2015-06-18 NOTE — Telephone Encounter (Signed)
Verified pharmacy   Patient advised that he is totally out of clonazePAM (KLONOPIN) 0.5 MG tablet [id number] . We set up a CPE for Wednesday. Please review.

## 2015-06-18 NOTE — Telephone Encounter (Signed)
RX faxed to [facility] 

## 2015-06-20 ENCOUNTER — Ambulatory Visit (INDEPENDENT_AMBULATORY_CARE_PROVIDER_SITE_OTHER): Payer: BLUE CROSS/BLUE SHIELD | Admitting: Internal Medicine

## 2015-06-20 ENCOUNTER — Encounter: Payer: Self-pay | Admitting: Internal Medicine

## 2015-06-20 VITALS — BP 116/84 | HR 65 | Temp 98.7°F | Resp 16 | Wt 200.0 lb

## 2015-06-20 DIAGNOSIS — Z8546 Personal history of malignant neoplasm of prostate: Secondary | ICD-10-CM | POA: Diagnosis not present

## 2015-06-20 DIAGNOSIS — K222 Esophageal obstruction: Secondary | ICD-10-CM | POA: Diagnosis not present

## 2015-06-20 DIAGNOSIS — H18609 Keratoconus, unspecified, unspecified eye: Secondary | ICD-10-CM | POA: Diagnosis not present

## 2015-06-20 DIAGNOSIS — R002 Palpitations: Secondary | ICD-10-CM | POA: Diagnosis not present

## 2015-06-20 DIAGNOSIS — G479 Sleep disorder, unspecified: Secondary | ICD-10-CM

## 2015-06-20 DIAGNOSIS — Z0000 Encounter for general adult medical examination without abnormal findings: Secondary | ICD-10-CM | POA: Diagnosis not present

## 2015-06-20 DIAGNOSIS — F419 Anxiety disorder, unspecified: Secondary | ICD-10-CM

## 2015-06-20 NOTE — Assessment & Plan Note (Addendum)
GERD controlled Continue daily medication No dysphagia

## 2015-06-20 NOTE — Assessment & Plan Note (Signed)
Likely related to increased stress, lightheadedness likely related to cardiac ischemia since he is able to exercise without any symptoms EKG today is normal He will continue to monitor and work on decreasing stress level He will let me know if symptoms worsen or change.

## 2015-06-20 NOTE — Patient Instructions (Addendum)
  Test(s) ordered today. Your results will be released to [place] (or called to you) after review, usually within 72hours after test completion. If any changes need to be made, you will be notified at that same time.  All other Health Maintenance issues reviewed.   All recommended immunizations and age-appropriate screenings are up-to-date or discussed.  No immunizations administered today.   Medications reviewed and updated.  No changes recommended at this time.   An EKG was done.   Please followup in one year, sooner if needed

## 2015-06-20 NOTE — Assessment & Plan Note (Signed)
Following with urology

## 2015-06-20 NOTE — Progress Notes (Signed)
Pre visit review using our clinic review tool, if applicable. No additional management support is needed unless otherwise documented below in the visit note. 

## 2015-06-20 NOTE — Progress Notes (Signed)
Subjective:    Patient ID: Caleb Jones, male    DOB: 1950/05/21, 65 y.o.   MRN: [id number]  HPI He is here for a physical exam.   He has gained a little more weight in the past few weeks related to stress eating.  He is trying to increase his walking.  He sleeps about 4-6 hours at night.  He does take the clonazepam nightly and started it at the time of his diagnosis with prostate cancer.  He is unable to sleep without the medication. Medication is not working as well recently. He eventually wants to come off of the medication, but his stress level is high. He is hoping to retire in a year and a half. He is hoping to increased walking will help control some stress. He was on lorazepam in the past and it has helped, but after a while it was not working as well. He isn't sure what he should do.  He has had a few episodes of palpitations. He thought this was probably related to some of his increased stress. Has occurred at times with exercise and at times at rest. He is able to exercise sometimes without any symptoms of chest pain, shortness of breath and palpitations.  Medications and allergies reviewed with patient and updated if appropriate.  Patient Active Problem List   Diagnosis Date Noted  . 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 Food impaction of esophagus 04/25/2014  . Esophageal stricture 04/25/2014  . Sleep disorder 12/21/2013  . Anxiety 01/22/2009  . PROSTATE CANCER, HX OF 01/11/2009    Current Outpatient Prescriptions on File Prior to Visit  Medication Sig Dispense Refill  . clonazePAM (KLONOPIN) 0.5 MG tablet Take 1 tablet (0.5 mg total) by mouth at bedtime as needed. 30 tablet 0  . omeprazole (PRILOSEC) 20 MG capsule Take 1 capsule (20 mg total) by mouth daily. 90 capsule 3   No current facility-administered medications on file prior to visit.    Past Medical History  Diagnosis Date  . History of prostate cancer 11/20/2008    Dr Alinda Money  . Pneumonia 01/2009    pleural  effusion tapped  . Anxiety     Past Surgical History  Procedure Laterality Date  . Refractive surgery  2007    for Retina Dethachment, Dr Jalene Mullet , [facility]  . Tonsillectomy    . Prostatectomy  11/20/2008    Dr Dutch Gray  . Colonoscopy  04/2008    Dr Sharlett Iles  . Esophagogastroduodenoscopy N/A 04/25/2014    Procedure: ESOPHAGOGASTRODUODENOSCOPY (EGD);  Surgeon: Irene Shipper, MD;  Location: [facility];  Service: Endoscopy;  Laterality: N/A;    Social History   Social History  . Marital Status: Single    Spouse Name: N/A  . Number of Children: N/A  . Years of Education: N/A   Occupational History  . Lobbyist    Social History Main Topics  . Smoking status: Never Smoker   . Smokeless tobacco: Never Used  . Alcohol Use: 0.6 oz/week    1 Cans of beer per week     Comment: Socially  . Drug Use: No  . Sexual Activity: Not Asked   Other Topics Concern  . None   Social History Narrative   Exercise: walking regularly    Family History  Problem Relation Age of Onset  . Dementia Mother   . Hypertension Mother   . Transient ischemic attack Mother     > 38  . Prostate cancer Father 78  metastatic to bones, lungs & cns  . Breast cancer Sister     <50  . Diabetes Maternal Aunt   . Heart disease Neg Hx   . Colon cancer Neg Hx   . Stomach cancer Neg Hx     Review of Systems  Constitutional: Positive for diaphoresis (a few times a night, none in last week). Negative for fever, chills, appetite change and fatigue.  Eyes: Negative for visual disturbance.  Respiratory: Positive for shortness of breath (with exercise). Negative for cough and wheezing.   Cardiovascular: Positive for palpitations (< once a week, not always to activity). Negative for chest pain and leg swelling.  Gastrointestinal: Negative for nausea, abdominal pain, diarrhea, constipation and blood in stool.       Gerd controlled  Genitourinary: Negative for dysuria, hematuria and difficulty  urinating.  Musculoskeletal: Negative for back pain and arthralgias.  Skin: Negative for color change and rash.  Neurological: Positive for dizziness (on occasion with palpitations) and headaches. Negative for light-headedness.  Psychiatric/Behavioral: Positive for sleep disturbance. Negative for dysphoric mood. The patient is nervous/anxious.        Objective:   Filed Vitals:   06/20/15 1505  BP: 116/84  Pulse: 65  Temp: 98.7 F (37.1 C)  Resp: 16   Filed Weights   06/20/15 1505  Weight: 200 lb (90.719 kg)   Body mass index is 25 kg/(m^2).   Physical Exam Constitutional: He appears well-developed and well-nourished. No distress.  HENT:  Head: Normocephalic and atraumatic.  Right Ear: External ear normal.  Left Ear: External ear normal.  Mouth/Throat: Oropharynx is clear and moist.  Normal ear canals and TM b/l  Eyes: Conjunctivae and EOM are normal.  Neck: Neck supple. No tracheal deviation present. No thyromegaly present.  No carotid bruit  Cardiovascular: Normal rate, regular rhythm, normal heart sounds and intact distal pulses.   No murmur heard. Pulmonary/Chest: Effort normal and breath sounds normal. No respiratory distress. He has no wheezes. He has no rales.  Abdominal: Soft. Bowel sounds are normal. He exhibits no distension. There is no tenderness.  Genitourinary:  deferred  Musculoskeletal: He exhibits no edema.  Lymphadenopathy:    He has no cervical adenopathy.  Skin: Skin is warm and dry. He is not diaphoretic.  Psychiatric: He has a normal mood and affect. His behavior is normal.         Assessment & Plan:   Physical exam: Screening blood work ordered Immunizations - will off on pneumovax booster - will give it in a year or two Colonoscopy Up to date  Eye exams  Up to date   EKG - today for palpitations Exercise - walking regularly Weight  - working on weight loss Skin  - no concerns Substance abuse - none   See Problem List for  Assessment and Plan of chronic medical problems.

## 2015-06-20 NOTE — Assessment & Plan Note (Signed)
Taking clonazepam nightly Increase walking Work on other ways he actually did help decrease anxiety He does not want to consider a daily medication at this time

## 2015-06-20 NOTE — Assessment & Plan Note (Signed)
Currently taking clonazepam nightly-seems to be slightly less effective in alleviating 4-6 hours at night We discussed options and he will continue with his current dose of clonazepam Can consider switching back to lorazepam to see if that is more helpful at this time Continue regular exercise and work on decreasing stress and natural ways Ultimately would like to come off of medication, but most likely we will need to wait until his stress level decreases with retirement

## 2015-06-29 ENCOUNTER — Other Ambulatory Visit (INDEPENDENT_AMBULATORY_CARE_PROVIDER_SITE_OTHER): Payer: BLUE CROSS/BLUE SHIELD

## 2015-06-29 DIAGNOSIS — Z0000 Encounter for general adult medical examination without abnormal findings: Secondary | ICD-10-CM | POA: Diagnosis not present

## 2015-06-29 LAB — CBC WITH DIFFERENTIAL/PLATELET
BASOS ABS: 0.1 10*3/uL (ref 0.0–0.1)
Basophils Relative: 0.7 % (ref 0.0–3.0)
EOS ABS: 0.2 10*3/uL (ref 0.0–0.7)
EOS PCT: 2.3 % (ref 0.0–5.0)
HCT: 44.7 % (ref 39.0–52.0)
Hemoglobin: 14.9 g/dL (ref 13.0–17.0)
LYMPHS ABS: 1.8 10*3/uL (ref 0.7–4.0)
Lymphocytes Relative: 24.7 % (ref 12.0–46.0)
MCHC: 33.4 g/dL (ref 30.0–36.0)
MCV: 91.7 fl (ref 78.0–100.0)
MONO ABS: 1 10*3/uL (ref 0.1–1.0)
Monocytes Relative: 13.6 % — ABNORMAL HIGH (ref 3.0–12.0)
NEUTROS PCT: 58.7 % (ref 43.0–77.0)
Neutro Abs: 4.4 10*3/uL (ref 1.4–7.7)
Platelets: 236 10*3/uL (ref 150.0–400.0)
RBC: 4.88 Mil/uL (ref 4.22–5.81)
RDW: 13.3 % (ref 11.5–15.5)
WBC: 7.5 10*3/uL (ref 4.0–10.5)

## 2015-06-29 LAB — COMPREHENSIVE METABOLIC PANEL
ALBUMIN: 4.2 g/dL (ref 3.5–5.2)
ALK PHOS: 60 U/L (ref 39–117)
ALT: 12 U/L (ref 0–53)
AST: 17 U/L (ref 0–37)
BILIRUBIN TOTAL: 0.7 mg/dL (ref 0.2–1.2)
BUN: 11 mg/dL (ref 6–23)
CO2: 28 mEq/L (ref 19–32)
CREATININE: 1.26 mg/dL (ref 0.40–1.50)
Calcium: 9.2 mg/dL (ref 8.4–10.5)
Chloride: 102 mEq/L (ref 96–112)
GFR: 61.03 mL/min (ref >60.00)
GLUCOSE: 86 mg/dL (ref 70–99)
Potassium: 4.1 mEq/L (ref 3.5–5.1)
SODIUM: 137 meq/L (ref 135–145)
TOTAL PROTEIN: 7.1 g/dL (ref 6.0–8.3)

## 2015-06-29 LAB — LIPID PANEL
CHOLESTEROL: 188 mg/dL (ref 0–200)
HDL: 52.8 mg/dL (ref >39.00)
LDL Cholesterol: 116 mg/dL — ABNORMAL HIGH (ref 0–99)
NONHDL: 135.55
Total CHOL/HDL Ratio: 4
Triglycerides: 100 mg/dL (ref 0.0–149.0)
VLDL: 20 mg/dL (ref 0.0–40.0)

## 2015-06-29 LAB — TSH: TSH: 5.12 u[IU]/mL — AB (ref 0.35–4.50)

## 2015-06-30 ENCOUNTER — Other Ambulatory Visit: Payer: Self-pay | Admitting: Internal Medicine

## 2015-06-30 DIAGNOSIS — R7989 Other specified abnormal findings of blood chemistry: Secondary | ICD-10-CM

## 2015-07-26 ENCOUNTER — Encounter: Payer: Self-pay | Admitting: Internal Medicine

## 2015-08-06 ENCOUNTER — Other Ambulatory Visit: Payer: Self-pay | Admitting: Internal Medicine

## 2015-08-06 MED ORDER — OMEPRAZOLE 20 MG PO CPDR: 20.0000 mg | DELAYED_RELEASE_CAPSULE | Freq: Every day | ORAL | 0 refills | Status: DC

## 2015-08-06 NOTE — Telephone Encounter (Signed)
Rx sent 

## 2015-08-31 ENCOUNTER — Other Ambulatory Visit (INDEPENDENT_AMBULATORY_CARE_PROVIDER_SITE_OTHER): Payer: BLUE CROSS/BLUE SHIELD

## 2015-08-31 DIAGNOSIS — R7989 Other specified abnormal findings of blood chemistry: Secondary | ICD-10-CM

## 2015-08-31 DIAGNOSIS — R946 Abnormal results of thyroid function studies: Secondary | ICD-10-CM

## 2015-08-31 LAB — TSH: TSH: 3.87 u[IU]/mL (ref 0.35–4.50)

## 2015-08-31 LAB — T4, FREE: Free T4: 0.89 ng/dL (ref 0.60–1.60)

## 2015-09-01 ENCOUNTER — Encounter: Payer: Self-pay | Admitting: Internal Medicine

## 2015-09-01 ENCOUNTER — Other Ambulatory Visit: Payer: Self-pay | Admitting: Internal Medicine

## 2015-09-01 DIAGNOSIS — E063 Autoimmune thyroiditis: Secondary | ICD-10-CM

## 2015-09-01 LAB — THYROID ANTIBODIES
Thyroglobulin Ab: <1 IU/mL (ref <2)
Thyroperoxidase Ab SerPl-aCnc: 35 IU/mL — ABNORMAL HIGH (ref <9)

## 2015-09-25 ENCOUNTER — Ambulatory Visit (INDEPENDENT_AMBULATORY_CARE_PROVIDER_SITE_OTHER): Payer: BLUE CROSS/BLUE SHIELD | Admitting: Internal Medicine

## 2015-09-25 ENCOUNTER — Encounter: Payer: Self-pay | Admitting: Internal Medicine

## 2015-09-25 VITALS — BP 118/72 | HR 76 | Ht 75.0 in | Wt 197.5 lb

## 2015-09-25 DIAGNOSIS — K219 Gastro-esophageal reflux disease without esophagitis: Secondary | ICD-10-CM

## 2015-09-25 DIAGNOSIS — K222 Esophageal obstruction: Secondary | ICD-10-CM

## 2015-09-25 MED ORDER — OMEPRAZOLE 20 MG PO CPDR: 20.0000 mg | DELAYED_RELEASE_CAPSULE | Freq: Every day | ORAL | 3 refills | Status: DC

## 2015-09-25 NOTE — Progress Notes (Signed)
HISTORY OF PRESENT ILLNESS:  Caleb Jones is a 65 y.o. male with a history of prostate cancer, anxiety, and GERD complicated by esophageal stricture with severe food impaction (April 2016) and associated esophageal laceration without perforation and associated aspiration pneumonitis. Subsequently underwent upper endoscopy after healing of the laceration. Dilated to 19 mm via balloon. Has continued on omeprazole 20 mg daily. Presents today follow-up. He denies reflux symptoms. Overall minimal dysphagia, though he does state he is chewing his food very carefully. Did have little trouble with pulled pork. Last colonoscopy 2010 was negative  REVIEW OF SYSTEMS:  All non-GI ROS negative except for anxiety  Past Medical History:  Diagnosis Date  . Anxiety   . Esophageal fissure   . Esophageal stricture   . GERD (gastroesophageal reflux disease)   . History of prostate cancer 11/20/2008   Dr Alinda Money  . Internal hemorrhoids   . Pneumonia 01/2009   pleural effusion tapped    Past Surgical History:  Procedure Laterality Date  . COLONOSCOPY  04/2008   Dr Sharlett Iles  . ESOPHAGOGASTRODUODENOSCOPY N/A 04/25/2014   Procedure: ESOPHAGOGASTRODUODENOSCOPY (EGD);  Surgeon: Irene Shipper, MD;  Location: [facility];  Service: Endoscopy;  Laterality: N/A;  . PROSTATECTOMY  11/20/2008   Dr Dutch Gray  . REFRACTIVE SURGERY  2007   for Retina Dethachment, Dr Jalene Mullet , [facility]  . TONSILLECTOMY      Social History Caleb Jones  reports that he has never smoked. He has never used smokeless tobacco. He reports that he drinks about 0.6 oz of alcohol per week . He reports that he does not use drugs.  family history includes Breast cancer in his sister; Cervical cancer in his mother; Dementia in his mother; Diabetes in his maternal aunt; Hypertension in his mother; Prostate cancer (age of onset: 56) in his father; Transient ischemic attack in his mother.  No Known Allergies     PHYSICAL EXAMINATION: Vital  signs: BP 118/72   Pulse 76   Ht 6\' 3"  (1.905 m)   Wt 197 lb 8 oz (89.6 kg)   BMI 24.69 kg/m   Constitutional: generally well-appearing, no acute distress Psychiatric: alert and oriented x3, cooperative Eyes: extraocular movements intact, anicteric, conjunctiva pink Mouth: oral pharynx moist, no lesions Neck: supple no lymphadenopathy Cardiovascular: heart regular rate and rhythm, no murmur Lungs: clear to auscultation bilaterally Abdomen: soft, nontender, nondistended, no obvious ascites, no peritoneal signs, normal bowel sounds, no organomegaly Rectal:Omitted Extremities: no clubbing cyanosis or lower extremity edema bilaterally Skin: no lesions on visible extremities Neuro: No focal deficits. Cranial nerves intact  ASSESSMENT:  #1. GERD complicated by esophageal stricture with severe food impaction and esophageal laceration/pulmonary aspiration April 2016 #2. Status post esophageal dilation to 19 mm balloon September 2016 #3. Doing well on PPI with minimal dysphagia   PLAN:  #1. Reflux precautions #2. Continue omeprazole 20 mg daily. Refilled prescription for him #3. Routine follow-up one year. Sooner if needed for recurrent dysphagia of any significance at which point he was having him up for repeat endoscopy with dilation. He understands and agrees #4. Routine screening colonoscopy 2020  25 minutes spent face-to-face with the patient. Greater than 50% a time use for counseling regarding his reflux disease and recommendation/plans as outlined. Questions answered

## 2015-09-25 NOTE — Patient Instructions (Signed)
We have filled your Omeprazole for one year.  Please come to the clinic in one year.  If you are age 65 or older, your body mass index should be between 23-30. Your Body mass index is 24.69 kg/m. If this is out of the aforementioned range listed, please consider follow up with your Primary Care Provider.  If you are age 14 or younger, your body mass index should be between 19-25. Your Body mass index is 24.69 kg/m. If this is out of the aformentioned range listed, please consider follow up with your Primary Care Provider.   Thank you for choosing [facility]  Dr Scarlette Shorts

## 2015-09-27 ENCOUNTER — Other Ambulatory Visit: Payer: Self-pay | Admitting: Internal Medicine

## 2015-09-27 NOTE — Telephone Encounter (Signed)
RX faxed to [facility] 

## 2015-11-05 ENCOUNTER — Encounter: Payer: Self-pay | Admitting: Internal Medicine

## 2015-11-05 ENCOUNTER — Ambulatory Visit (INDEPENDENT_AMBULATORY_CARE_PROVIDER_SITE_OTHER): Payer: BLUE CROSS/BLUE SHIELD | Admitting: Internal Medicine

## 2015-11-05 VITALS — BP 134/84 | HR 58 | Temp 98.7°F | Resp 16 | Wt 199.0 lb

## 2015-11-05 DIAGNOSIS — J0100 Acute maxillary sinusitis, unspecified: Secondary | ICD-10-CM

## 2015-11-05 NOTE — Patient Instructions (Signed)

## 2015-11-05 NOTE — Progress Notes (Signed)
Pre visit review using our clinic review tool, if applicable. No additional management support is needed unless otherwise documented below in the visit note. 

## 2015-11-05 NOTE — Progress Notes (Signed)
Subjective:    Patient ID: Caleb Jones, male    DOB: 11-22-1950, 65 y.o.   MRN: [id number]  HPI He is here for an acute visit.   His symptoms started last Friday or Saturday, 2- 3 days ago.  He states a subjective fever, nasal congestion, ear pain, mild postnasal drip, sinus pressure, sore throat, muscle aches and headaches. He denies any cough, wheeze, shortness of breath, chest pain, diarrhea or lightheadedness/dizziness. He has taken Tylenol on a couple of occasions using over-the-counter nose spray.  He is drinking plenty of water.     Medications and allergies reviewed with patient and updated if appropriate.  Patient Active Problem List   Diagnosis Date Noted  . Hashimoto's thyroiditis 09/01/2015  . Elevated TSH 06/30/2015  . 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 Palpitations 06/20/2015  . Food impaction of esophagus 04/25/2014  . Esophageal stricture 04/25/2014  . Sleep disorder 12/21/2013  . Anxiety 01/22/2009  . PROSTATE CANCER, HX OF 01/11/2009    Current Outpatient Prescriptions on File Prior to Visit  Medication Sig Dispense Refill  . acetaminophen (TYLENOL) 500 MG tablet Take 500 mg by mouth at bedtime as needed.    . clonazePAM (KLONOPIN) 0.5 MG tablet TAKE 1 TABLET BY MOUTH AT BEDTIME AS NEEDED 30 tablet 0  . Multiple Vitamins-Minerals (MULTIVITAMIN ADULT PO) Take by mouth 1 day or 1 dose.    [name] omeprazole (PRILOSEC) 20 MG capsule Take 1 capsule (20 mg total) by mouth daily. 90 capsule 3   No current facility-administered medications on file prior to visit.     Past Medical History:  Diagnosis Date  . Anxiety   . Esophageal fissure   . Esophageal stricture   . GERD (gastroesophageal reflux disease)   . History of prostate cancer 11/20/2008   Dr Alinda Money  . Internal hemorrhoids   . Pneumonia 01/2009   pleural effusion tapped    Past Surgical History:  Procedure Laterality Date  . COLONOSCOPY  04/2008   Dr Sharlett Iles  . ESOPHAGOGASTRODUODENOSCOPY N/A 04/25/2014     Procedure: ESOPHAGOGASTRODUODENOSCOPY (EGD);  Surgeon: Irene Shipper, MD;  Location: [facility];  Service: Endoscopy;  Laterality: N/A;  . PROSTATECTOMY  11/20/2008   Dr Dutch Gray  . REFRACTIVE SURGERY  2007   for Retina Dethachment, Dr Jalene Mullet , [facility]  . TONSILLECTOMY      Social History   Social History  . Marital status: Single    Spouse name: N/A  . Number of children: N/A  . Years of education: N/A   Occupational History  . Lobbyist    Social History Main Topics  . Smoking status: Never Smoker  . Smokeless tobacco: Never Used  . Alcohol use 0.6 oz/week    1 Cans of beer per week     Comment: Socially  . Drug use: No  . Sexual activity: Not on file   Other Topics Concern  . Not on file   Social History Narrative   Exercise: walking regularly    Family History  Problem Relation Age of Onset  . Dementia Mother   . Hypertension Mother   . Transient ischemic attack Mother     > 45  . Cervical cancer Mother   . Prostate cancer Father 70     metastatic to bones, lungs & cns  . Breast cancer Sister     <50  . Diabetes Maternal Aunt   . Heart disease Neg Hx   . Colon cancer Neg Hx   .  Stomach cancer Neg Hx     Review of Systems  Constitutional: Positive for fever (subjective).  HENT: Positive for congestion, ear pain, postnasal drip (mild), sinus pressure and sore throat.   Respiratory: Negative for cough, shortness of breath and wheezing.   Cardiovascular: Negative for chest pain.  Gastrointestinal: Negative for diarrhea and nausea.  Musculoskeletal: Positive for myalgias.  Neurological: Positive for headaches. Negative for dizziness and light-headedness.       Objective:   Vitals:   11/05/15 1111  BP: 134/84  Pulse: (!) 58  Resp: 16  Temp: 98.7 F (37.1 C)   Filed Weights   11/05/15 1111  Weight: 199 lb (90.3 kg)   Body mass index is 24.87 kg/m.   Physical Exam GENERAL APPEARANCE: Appears stated age, well appearing,  NAD EYES: conjunctiva clear, no icterus HEENT: bilateral tympanic membranes and ear canals normal, oropharynx with no erythema, no thyromegaly, trachea midline, no cervical or supraclavicular lymphadenopathy LUNGS: Clear to auscultation without wheeze or crackles, unlabored breathing, good air entry bilaterally HEART: Normal S1,S2 without murmurs EXTREMITIES: Without clubbing, cyanosis, or edema      Assessment & Plan:   See Problem List for Assessment and Plan of chronic medical problems.

## 2015-11-26 ENCOUNTER — Other Ambulatory Visit: Payer: Self-pay | Admitting: Internal Medicine

## 2015-12-27 ENCOUNTER — Ambulatory Visit: Payer: BLUE CROSS/BLUE SHIELD | Admitting: Internal Medicine

## 2016-02-25 ENCOUNTER — Other Ambulatory Visit: Payer: Self-pay | Admitting: Internal Medicine

## 2016-05-14 ENCOUNTER — Ambulatory Visit (INDEPENDENT_AMBULATORY_CARE_PROVIDER_SITE_OTHER): Payer: BLUE CROSS/BLUE SHIELD | Admitting: Internal Medicine

## 2016-05-14 ENCOUNTER — Encounter: Payer: Self-pay | Admitting: Internal Medicine

## 2016-05-14 VITALS — BP 146/86 | HR 72 | Temp 98.7°F | Resp 16 | Wt 194.0 lb

## 2016-05-14 DIAGNOSIS — G479 Sleep disorder, unspecified: Secondary | ICD-10-CM

## 2016-05-14 DIAGNOSIS — F419 Anxiety disorder, unspecified: Secondary | ICD-10-CM | POA: Diagnosis not present

## 2016-05-14 DIAGNOSIS — F329 Major depressive disorder, single episode, unspecified: Secondary | ICD-10-CM | POA: Insufficient documentation

## 2016-05-14 MED ORDER — SERTRALINE HCL 50 MG PO TABS: 50.0000 mg | ORAL_TABLET | Freq: Every day | ORAL | 3 refills | Status: DC

## 2016-05-14 MED ORDER — CLONAZEPAM 0.5 MG PO TABS: 0.5000 mg | ORAL_TABLET | Freq: Every evening | ORAL | 5 refills | Status: DC | PRN

## 2016-05-14 NOTE — Assessment & Plan Note (Signed)
Not controlled Related to work Causing palpitations, lightheadedness/weakness and difficulty with sleep Continue clonazepam at night - increase to one full pill 0.5 mg  Start sertraline 50 mg at night Discussed possible side effects f/u in 6 weeks, sooner if needed

## 2016-05-14 NOTE — Progress Notes (Signed)
Subjective:    Patient ID: Caleb Jones, male    DOB: 01-May-1950, 66 y.o.   MRN: [id number]  HPI The patient is here for follow up.  Anxiety, difficulty sleeping:  He has had increased stress, higher anxiety from work.  He had been taking 1/2 pill of clonazepam at night up until two weeks ago and then increased the clonazepam to 3/4 of a pill. The increase helped his sleep, but last night he only slept 4 hrs.  He has minor epiosdes of weakness and lightheadedness, which he feels is related to stress.  He states self doubt, lower self esteem, some difficulty with  focus/concentration and multitasking.  He wonders if he is depressed at well.  He has had increased palpitations.    Back pain:  It started 3-4 weeks ago.  He has had increased stress and feels that is the cause of the pain.  He pulled a muscle in the lower back. He had pain across the lower back. There was no radiation of pain, numbness or weakness in the legs.  He put a heating pad on it.  [facility].  He was placed on prednisone, ibuprofen and capsaisin gel [facility].  He has been walking 3 miles daily and has been more consistent with that.  He knows he needs to continue this to help with the back. His back is better.      Medications and allergies reviewed with patient and updated if appropriate.  Patient Active Problem List   Diagnosis Date Noted  . Hashimoto's thyroiditis 09/01/2015  . Elevated TSH 06/30/2015  . 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 Palpitations 06/20/2015  . Food impaction of esophagus 04/25/2014  . Esophageal stricture 04/25/2014  . Sleep disorder 12/21/2013  . Anxiety 01/22/2009  . PROSTATE CANCER, HX OF 01/11/2009    Current Outpatient Prescriptions on File Prior to Visit  Medication Sig Dispense Refill  . acetaminophen (TYLENOL) 500 MG tablet Take 500 mg by mouth at bedtime as needed.    . clonazePAM (KLONOPIN) 0.5 MG tablet TAKE 1 TABLET  AT BEDTIME AS NEEDED 30 tablet 0  . Multiple Vitamins-Minerals (MULTIVITAMIN ADULT PO) Take by mouth 1 day or 1 dose.    [name] omeprazole (PRILOSEC) 20 MG capsule Take 1 capsule (20 mg total) by mouth daily. 90 capsule 3   No current facility-administered medications on file prior to visit.     Past Medical History:  Diagnosis Date  . Anxiety   . Esophageal fissure   . Esophageal stricture   . GERD (gastroesophageal reflux disease)   . History of prostate cancer 11/20/2008   Dr Alinda Money  . Internal hemorrhoids   . Pneumonia 01/2009   pleural effusion tapped    Past Surgical History:  Procedure Laterality Date  . COLONOSCOPY  04/2008   Dr Sharlett Iles  . ESOPHAGOGASTRODUODENOSCOPY N/A 04/25/2014   Procedure: ESOPHAGOGASTRODUODENOSCOPY (EGD);  Surgeon: Irene Shipper, MD;  Location: [facility];  Service: Endoscopy;  Laterality: N/A;  . PROSTATECTOMY  11/20/2008   Dr Dutch Gray  . REFRACTIVE SURGERY  2007   for Retina Dethachment, Dr Jalene Mullet , [facility]  . TONSILLECTOMY      Social History   Social History  . Marital status: Single    Spouse name: N/A  . Number of children: N/A  . Years of education: N/A   Occupational History  . Lobbyist    Social History Main Topics  .  Smoking status: Never Smoker  . Smokeless tobacco: Never Used  . Alcohol use 0.6 oz/week    1 Cans of beer per week     Comment: Socially  . Drug use: No  . Sexual activity: Not on file   Other Topics Concern  . Not on file   Social History Narrative   Exercise: walking regularly    Family History  Problem Relation Age of Onset  . Dementia Mother   . Hypertension Mother   . Transient ischemic attack Mother     > 52  . Cervical cancer Mother   . Prostate cancer Father 96     metastatic to bones, lungs & cns  . Breast cancer Sister     <50  . Diabetes Maternal Aunt   . Heart disease Neg Hx   . Colon cancer Neg Hx   . Stomach cancer Neg Hx     Review of Systems  Constitutional:  Positive for appetite change. Negative for fever.  Respiratory: Negative for cough, shortness of breath and wheezing.   Cardiovascular: Positive for palpitations. Negative for chest pain.  Gastrointestinal: Negative for abdominal pain.  Neurological: Positive for light-headedness. Negative for headaches.       Objective:   Vitals:   05/14/16 0914  BP: (!) 146/86  Pulse: 72  Resp: 16  Temp: 98.7 F (37.1 C)   Wt Readings from Last 3 Encounters:  05/14/16 194 lb (88 kg)  11/05/15 199 lb (90.3 kg)  09/25/15 197 lb 8 oz (89.6 kg)   Body mass index is 24.25 kg/m.   Physical Exam    Constitutional: Appears well-developed and well-nourished. No distress.  HENT:  Head: Normocephalic and atraumatic.  Neck: Neck supple. No tracheal deviation present. No thyromegaly present.  No cervical lymphadenopathy Cardiovascular: Normal rate, regular rhythm and normal heart sounds.   No murmur heard. No carotid bruit .  No edema Pulmonary/Chest: Effort normal and breath sounds normal. No respiratory distress. No has no wheezes. No rales.  Skin: Skin is warm and dry. Not diaphoretic.  Psychiatric: depressed mood and affect. Behavior is normal.      Assessment & Plan:    See Problem List for Assessment and Plan of chronic medical problems.    FU 6 weeks for CPE and follow up anxiety/depression

## 2016-05-14 NOTE — Patient Instructions (Addendum)
   Medications reviewed and updated.  Changes include starting sertraline 50 mg daily at night.   Your prescription(s) have been submitted to your pharmacy. Please take as directed and contact our office if you believe you are having problem(s) with the medication(s).   Please followup in 6 weeks, sooner if needed

## 2016-05-14 NOTE — Assessment & Plan Note (Signed)
Related to stress, anxiety from work Having multiple symptoms consistent with depression Start sertraline 50 mg at night Discussed possible side effects f/u in 6 weeks, sooner if needed

## 2016-05-14 NOTE — Assessment & Plan Note (Signed)
Not controlled Related to stress, anxiety Increase clonazepam to 0.5 mg nightly Start sertraline 50 mg at night

## 2016-06-25 ENCOUNTER — Other Ambulatory Visit (INDEPENDENT_AMBULATORY_CARE_PROVIDER_SITE_OTHER): Payer: BLUE CROSS/BLUE SHIELD

## 2016-06-25 ENCOUNTER — Encounter: Payer: Self-pay | Admitting: Internal Medicine

## 2016-06-25 ENCOUNTER — Ambulatory Visit (INDEPENDENT_AMBULATORY_CARE_PROVIDER_SITE_OTHER): Payer: BLUE CROSS/BLUE SHIELD | Admitting: Internal Medicine

## 2016-06-25 VITALS — BP 124/86 | HR 66 | Temp 98.1°F | Resp 16 | Ht 75.0 in | Wt 186.0 lb

## 2016-06-25 DIAGNOSIS — F419 Anxiety disorder, unspecified: Secondary | ICD-10-CM

## 2016-06-25 DIAGNOSIS — Z1159 Encounter for screening for other viral diseases: Secondary | ICD-10-CM | POA: Diagnosis not present

## 2016-06-25 DIAGNOSIS — E063 Autoimmune thyroiditis: Secondary | ICD-10-CM

## 2016-06-25 DIAGNOSIS — R7989 Other specified abnormal findings of blood chemistry: Secondary | ICD-10-CM

## 2016-06-25 DIAGNOSIS — F329 Major depressive disorder, single episode, unspecified: Secondary | ICD-10-CM | POA: Diagnosis not present

## 2016-06-25 DIAGNOSIS — Z0000 Encounter for general adult medical examination without abnormal findings: Secondary | ICD-10-CM | POA: Diagnosis not present

## 2016-06-25 DIAGNOSIS — R946 Abnormal results of thyroid function studies: Secondary | ICD-10-CM | POA: Diagnosis not present

## 2016-06-25 DIAGNOSIS — Z8546 Personal history of malignant neoplasm of prostate: Secondary | ICD-10-CM

## 2016-06-25 LAB — CBC WITH DIFFERENTIAL/PLATELET
BASOS PCT: 0.9 % (ref 0.0–3.0)
Basophils Absolute: 0.1 10*3/uL (ref 0.0–0.1)
EOS PCT: 1.4 % (ref 0.0–5.0)
Eosinophils Absolute: 0.1 10*3/uL (ref 0.0–0.7)
HCT: 44.9 % (ref 39.0–52.0)
Hemoglobin: 15.4 g/dL (ref 13.0–17.0)
LYMPHS ABS: 1.8 10*3/uL (ref 0.7–4.0)
Lymphocytes Relative: 22.6 % (ref 12.0–46.0)
MCHC: 34.2 g/dL (ref 30.0–36.0)
MCV: 91.4 fl (ref 78.0–100.0)
MONOS PCT: 13.1 % — AB (ref 3.0–12.0)
Monocytes Absolute: 1 10*3/uL (ref 0.1–1.0)
NEUTROS ABS: 5 10*3/uL (ref 1.4–7.7)
NEUTROS PCT: 62 % (ref 43.0–77.0)
Platelets: 222 10*3/uL (ref 150.0–400.0)
RBC: 4.91 Mil/uL (ref 4.22–5.81)
RDW: 12.9 % (ref 11.5–15.5)
WBC: 8 10*3/uL (ref 4.0–10.5)

## 2016-06-25 LAB — COMPREHENSIVE METABOLIC PANEL
ALK PHOS: 58 U/L (ref 39–117)
ALT: 13 U/L (ref 0–53)
AST: 19 U/L (ref 0–37)
Albumin: 4.4 g/dL (ref 3.5–5.2)
BUN: 12 mg/dL (ref 6–23)
CO2: 28 mEq/L (ref 19–32)
Calcium: 9.5 mg/dL (ref 8.4–10.5)
Chloride: 99 mEq/L (ref 96–112)
Creatinine, Ser: 1.2 mg/dL (ref 0.40–1.50)
GFR: 64.37 mL/min (ref >60.00)
Glucose, Bld: 89 mg/dL (ref 70–99)
POTASSIUM: 4.8 meq/L (ref 3.5–5.1)
Sodium: 134 mEq/L — ABNORMAL LOW (ref 135–145)
TOTAL PROTEIN: 7 g/dL (ref 6.0–8.3)
Total Bilirubin: 0.7 mg/dL (ref 0.2–1.2)

## 2016-06-25 LAB — LIPID PANEL
CHOLESTEROL: 182 mg/dL (ref 0–200)
HDL: 55.8 mg/dL (ref >39.00)
LDL Cholesterol: 111 mg/dL — ABNORMAL HIGH (ref 0–99)
NONHDL: 126.68
Total CHOL/HDL Ratio: 3
Triglycerides: 77 mg/dL (ref 0.0–149.0)
VLDL: 15.4 mg/dL (ref 0.0–40.0)

## 2016-06-25 LAB — HEPATITIS C ANTIBODY: HCV Ab: NEGATIVE

## 2016-06-25 LAB — TSH: TSH: 3.47 u[IU]/mL (ref 0.35–4.50)

## 2016-06-25 MED ORDER — SERTRALINE HCL 100 MG PO TABS: 100.0000 mg | ORAL_TABLET | Freq: Every day | ORAL | 8 refills | Status: DC

## 2016-06-25 MED ORDER — CLONAZEPAM 0.5 MG PO TABS: 0.5000 mg | ORAL_TABLET | Freq: Every day | ORAL | 5 refills | Status: DC

## 2016-06-25 NOTE — Progress Notes (Addendum)
Subjective:    Patient ID: Caleb Jones, male    DOB: April 19, 1950, 66 y.o.   MRN: [id number]  HPI He is here for a physical exam.   He has increased his walking and that has helped with his anxiety and depression.  The zoloft has helped.  He is unsure if he needs a higher dose.     Medications and allergies reviewed with patient and updated if appropriate.  Patient Active Problem List   Diagnosis Date Noted  . Reactive depression 05/14/2016  . Hashimoto's thyroiditis 09/01/2015  . Elevated TSH 06/30/2015  . 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 Palpitations 06/20/2015  . Food impaction of esophagus 04/25/2014  . Esophageal stricture 04/25/2014  . Sleep disorder 12/21/2013  . Anxiety 01/22/2009  . PROSTATE CANCER, HX OF 01/11/2009    Current Outpatient Prescriptions on File Prior to Visit  Medication Sig Dispense Refill  . acetaminophen (TYLENOL) 500 MG tablet Take 500 mg by mouth at bedtime as needed.    . Multiple Vitamins-Minerals (MULTIVITAMIN ADULT PO) Take by mouth 1 day or 1 dose.    [name] omeprazole (PRILOSEC) 20 MG capsule Take 1 capsule (20 mg total) by mouth daily. 90 capsule 3  . sertraline (ZOLOFT) 50 MG tablet Take 1 tablet (50 mg total) by mouth at bedtime. 30 tablet 3   No current facility-administered medications on file prior to visit.     Past Medical History:  Diagnosis Date  . Anxiety   . Esophageal fissure   . Esophageal stricture   . GERD (gastroesophageal reflux disease)   . History of prostate cancer 11/20/2008   Dr Alinda Money  . Internal hemorrhoids   . Pneumonia 01/2009   pleural effusion tapped    Past Surgical History:  Procedure Laterality Date  . COLONOSCOPY  04/2008   Dr Sharlett Iles  . ESOPHAGOGASTRODUODENOSCOPY N/A 04/25/2014   Procedure: ESOPHAGOGASTRODUODENOSCOPY (EGD);  Surgeon: Irene Shipper, MD;  Location: [facility];  Service: Endoscopy;  Laterality: N/A;  . PROSTATECTOMY  11/20/2008   Dr Dutch Gray  . REFRACTIVE SURGERY  2007   for Retina  Dethachment, Dr Jalene Mullet , [facility]  . TONSILLECTOMY      Social History   Social History  . Marital status: Single    Spouse name: N/A  . Number of children: N/A  . Years of education: N/A   Occupational History  . Lobbyist    Social History Main Topics  . Smoking status: Never Smoker  . Smokeless tobacco: Never Used  . Alcohol use 0.6 oz/week    1 Cans of beer per week     Comment: Socially  . Drug use: No  . Sexual activity: Not Asked   Other Topics Concern  . None   Social History Narrative   Exercise: walking regularly    Family History  Problem Relation Age of Onset  . Dementia Mother   . Hypertension Mother   . Transient ischemic attack Mother        > 45  . Cervical cancer Mother   . Prostate cancer Father 33        metastatic to bones, lungs & cns  . Breast cancer Sister        <50  . Diabetes Maternal Aunt   . Heart disease Neg Hx   . Colon cancer Neg Hx   . Stomach cancer Neg Hx     Review of Systems  Constitutional: Negative for appetite change, chills, fatigue, fever and unexpected weight change.  Eyes: Negative for visual disturbance.  Respiratory: Negative for cough, shortness of breath and wheezing.   Cardiovascular: Positive for palpitations (once a week) and leg swelling (trace L > R). Negative for chest pain.  Gastrointestinal: Negative for abdominal pain, blood in stool, constipation, diarrhea and nausea.       No gerd  Genitourinary: Negative for dysuria and hematuria.  Musculoskeletal: Negative for arthralgias and back pain.  Skin: Negative for color change and rash.  Neurological: Positive for light-headedness (when stands up quickly) and headaches (occasional).  Psychiatric/Behavioral: Positive for dysphoric mood (? ideally controlled) and sleep disturbance (good with clonazepam). The patient is nervous/anxious.        Objective:   Vitals:   06/25/16 0807  BP: 124/86  Pulse: 66  Resp: 16  Temp: 98.1 F (36.7 C)    Filed Weights   06/25/16 0807  Weight: 186 lb (84.4 kg)   Body mass index is 23.25 kg/m.  Wt Readings from Last 3 Encounters:  06/25/16 186 lb (84.4 kg)  05/14/16 194 lb (88 kg)  11/05/15 199 lb (90.3 kg)     Physical Exam Constitutional: He appears well-developed and well-nourished. No distress.  HENT:  Head: Normocephalic and atraumatic.  Right Ear: External ear normal.  Left Ear: External ear normal.  Mouth/Throat: Oropharynx is clear and moist.  Normal ear canals and TM b/l  Eyes: Conjunctivae and EOM are normal.  Neck: Neck supple. No tracheal deviation present. No thyromegaly present.  No carotid bruit  Cardiovascular: Normal rate, regular rhythm, normal heart sounds and intact distal pulses.   No murmur heard. Pulmonary/Chest: Effort normal and breath sounds normal. No respiratory distress. He has no wheezes. He has no rales.  Abdominal: Soft. Bowel sounds are normal. He exhibits no distension. There is no tenderness.  Genitourinary: deferred to urology Musculoskeletal: He exhibits no edema.  Lymphadenopathy:   He has no cervical adenopathy.  Skin: Skin is warm and dry. He is not diaphoretic.  Psychiatric: He has a normal mood and affect. His behavior is normal.         Assessment & Plan:   Physical exam: Screening blood work  ordered Immunizations   Pneumovax deferred, discussed shingrix Colonoscopy   Up to date  Eye exams   Up to date  EKG   Done 06/2015 Exercise  - walking regularly Weight    Normal BMI Skin     no concerns Substance abuse    none  See Problem List for Assessment and Plan of chronic medical problems.

## 2016-06-25 NOTE — Assessment & Plan Note (Addendum)
Anxiety improved with sertraline Still with some anxiety - increase dose to 100 mg Clonazepam at night

## 2016-06-25 NOTE — Assessment & Plan Note (Addendum)
Started sertraline several weeks ago No side effects Depression improved, but still present Will try increasing dose to 100 mg

## 2016-06-25 NOTE — Patient Instructions (Addendum)
Test(s) ordered today. Your results will be released to [place] (or called to you) after review, usually within 72hours after test completion. If any changes need to be made, you will be notified at that same time.  All other Health Maintenance issues reviewed.   All recommended immunizations and age-appropriate screenings are up-to-date or discussed.  No immunization administered today.   Medications reviewed and updated.  Changes include increasing sertraline to 100 mg daily.   Your prescription(s) have been submitted to your pharmacy. Please take as directed and contact our office if you believe you are having problem(s) with the medication(s).   Please followup in 6 months    Health Maintenance, Male A healthy lifestyle and preventive care is important for your health and wellness. Ask your health care provider about what schedule of regular examinations is right for you. What should I know about weight and diet? Eat a Healthy Diet  Eat plenty of vegetables, fruits, whole grains, low-fat dairy products, and lean protein.  Do not eat a lot of foods high in solid fats, added sugars, or salt.  Maintain a Healthy Weight Regular exercise can help you achieve or maintain a healthy weight. You should:  Do at least 150 minutes of exercise each week. The exercise should increase your heart rate and make you sweat (moderate-intensity exercise).  Do strength-training exercises at least twice a week.  Watch Your Levels of Cholesterol and Blood Lipids  Have your blood tested for lipids and cholesterol every 5 years starting at 66 years of age. If you are at high risk for heart disease, you should start having your blood tested when you are 66 years old. You may need to have your cholesterol levels checked more often if: ? Your lipid or cholesterol levels are high. ? You are older than 66 years of age. ? You are at high risk for heart disease.  What should I know about cancer  screening? Many types of cancers can be detected early and may often be prevented. Lung Cancer  You should be screened every year for lung cancer if: ? You are a current smoker who has smoked for at least 30 years. ? You are a former smoker who has quit within the past 15 years.  Talk to your health care provider about your screening options, when you should start screening, and how often you should be screened.  Colorectal Cancer  Routine colorectal cancer screening usually begins at 66 years of age and should be repeated every 5-10 years until you are 67 years old. You may need to be screened more often if early forms of precancerous polyps or small growths are found. Your health care provider may recommend screening at an earlier age if you have risk factors for colon cancer.  Your health care provider may recommend using home test kits to check for hidden blood in the stool.  A small camera at the end of a tube can be used to examine your colon (sigmoidoscopy or colonoscopy). This checks for the earliest forms of colorectal cancer.  Prostate and Testicular Cancer  Depending on your age and overall health, your health care provider may do certain tests to screen for prostate and testicular cancer.  Talk to your health care provider about any symptoms or concerns you have about testicular or prostate cancer.  Skin Cancer  Check your skin from head to toe regularly.  Tell your health care provider about any new moles or changes in moles, especially if: ?  There is a change in a mole's size, shape, or color. ? You have a mole that is larger than a pencil eraser.  Always use sunscreen. Apply sunscreen liberally and repeat throughout the day.  Protect yourself by wearing long sleeves, pants, a wide-brimmed hat, and sunglasses when outside.  What should I know about heart disease, diabetes, and high blood pressure?  If you are 60-10 years of age, have your blood pressure checked  every 3-5 years. If you are 87 years of age or older, have your blood pressure checked every year. You should have your blood pressure measured twice-once when you are at a hospital or clinic, and once when you are not at a hospital or clinic. Record the average of the two measurements. To check your blood pressure when you are not at a hospital or clinic, you can use: ? An automated blood pressure machine at a pharmacy. ? A home blood pressure monitor.  Talk to your health care provider about your target blood pressure.  If you are between 67-62 years old, ask your health care provider if you should take aspirin to prevent heart disease.  Have regular diabetes screenings by checking your fasting blood sugar level. ? If you are at a normal weight and have a low risk for diabetes, have this test once every three years after the age of 33. ? If you are overweight and have a high risk for diabetes, consider being tested at a younger age or more often.  A one-time screening for abdominal aortic aneurysm (AAA) by ultrasound is recommended for men aged 28-75 years who are current or former smokers. What should I know about preventing infection? Hepatitis B If you have a higher risk for hepatitis B, you should be screened for this virus. Talk with your health care provider to find out if you are at risk for hepatitis B infection. Hepatitis C Blood testing is recommended for:  Everyone born from [birth year] through 1965.  Anyone with known risk factors for hepatitis C.  Sexually Transmitted Diseases (STDs)  You should be screened each year for STDs including gonorrhea and chlamydia if: ? You are sexually active and are younger than 66 years of age. ? You are older than 66 years of age and your health care provider tells you that you are at risk for this type of infection. ? Your sexual activity has changed since you were last screened and you are at an increased risk for chlamydia or gonorrhea. Ask your  health care provider if you are at risk.  Talk with your health care provider about whether you are at high risk of being infected with HIV. Your health care provider may recommend a prescription medicine to help prevent HIV infection.  What else can I do?  Schedule regular health, dental, and eye exams.  Stay current with your vaccines (immunizations).  Do not use any tobacco products, such as cigarettes, chewing tobacco, and e-cigarettes. If you need help quitting, ask your health care provider.  Limit alcohol intake to no more than 2 drinks per day. One drink equals 12 ounces of beer, 5 ounces of wine, or 1 ounces of hard liquor.  Do not use street drugs.  Do not share needles.  Ask your health care provider for help if you need support or information about quitting drugs.  Tell your health care provider if you often feel depressed.  Tell your health care provider if you have ever been abused or do not feel  safe at home. This information is not intended to replace advice given to you by your health care provider. Make sure you discuss any questions you have with your health care provider. Document Released: 06/21/2007 Document Revised: 08/22/2015 Document Reviewed: 09/26/2014 Elsevier Interactive Patient Education  [employer].

## 2016-06-25 NOTE — Assessment & Plan Note (Signed)
Following with urology

## 2016-06-25 NOTE — Assessment & Plan Note (Signed)
Check tsh 

## 2016-06-25 NOTE — Assessment & Plan Note (Signed)
H/o elevated tsh  - check tsh

## 2016-06-26 ENCOUNTER — Encounter: Payer: Self-pay | Admitting: Internal Medicine

## 2016-07-08 ENCOUNTER — Other Ambulatory Visit: Payer: Self-pay | Admitting: Emergency Medicine

## 2016-07-08 MED ORDER — SERTRALINE HCL 100 MG PO TABS: 100.0000 mg | ORAL_TABLET | Freq: Every day | ORAL | 1 refills | Status: DC

## 2016-07-16 ENCOUNTER — Telehealth: Payer: Self-pay | Admitting: Internal Medicine

## 2016-07-16 NOTE — Telephone Encounter (Signed)
Pt is requesting clonazePAM (KLONOPIN) 0.5 MG tablet to be filled early.

## 2016-07-16 NOTE — Telephone Encounter (Signed)
Can patient - see if he is taking more than prescribed.  Advise him this can not be filled early.

## 2016-07-16 NOTE — Telephone Encounter (Signed)
Please advise, spoke with pharmacy and they states RX was last filled on 06/25/16. Are you okay with pt filling 9 days early?

## 2016-07-17 NOTE — Telephone Encounter (Signed)
Spoke with pt: his last RXs were  Sertraline 30 tablets filled on 06/25/16  Clonazepam 06/24/16 #30 4 refills take 1 tab by mouth at bed time PRN, takes 2 tabs only on occasion.   Contacted pharmacy, they did not discontinue the old rxs. Pt is okay to have refill tomorrow. Pt has been contacted and aware.

## 2016-07-17 NOTE — Telephone Encounter (Signed)
Spoke with pt, pt is going to verify quantity and give me a call back. He is going out of town next week and wants to make sure he does not run out while on his business trip.

## 2016-07-17 NOTE — Telephone Encounter (Signed)
Pt called back please call back  °

## 2016-07-17 NOTE — Telephone Encounter (Signed)
LVM for pt to call back and discuss.  

## 2016-10-07 ENCOUNTER — Other Ambulatory Visit: Payer: Self-pay | Admitting: Internal Medicine

## 2016-12-23 NOTE — Patient Instructions (Addendum)
  Test(s) ordered today. Your results will be released to MyChart (or called to you) after review, usually within 72hours after test completion. If any changes need to be made, you will be notified at that same time.  No immunizations administered today.   Medications reviewed and updated.  No changes recommended at this time.  Please followup in 6 months   

## 2016-12-23 NOTE — Progress Notes (Signed)
Subjective:    Patient ID: Caleb Jones, male    DOB: 1950/02/01, 66 y.o.   MRN: [id number]  HPI The patient is here for follow up.  Depression: He is taking his medication daily as prescribed. He denies any side effects from the medication. He feels his depression is well controlled and he is happy with his current dose of medication.  The increase in sertraline at his last visit helped.    Anxiety,insomnia: He is taking his medication daily as prescribed. He denies any side effects from the medication. He feels his anxiety is controlled.  The increase in sertraline dose several months ago helped. He has had a couple of panic attacks that lasted several minutes.  He was able to get through them.  He knows what triggered them.  Overall his sleep is controlled.  Esophageal stricture:  He is taking the omeprazole daily.  He has been eating too fast and eating too large of bites and has had mild dysphagia.  He is hoping change in his eating habits will improve those symptoms, if not he knows he needs to see GI.  He burps but denies GERD.    Elevated tsh in past, thyroperoxidase Ab positive 08/2015:  He has had lower energy level since not walking regularly about one month ago.  He thinks the lower energy is from not walking.  He denies any changes in his weight.  Medications and allergies reviewed with patient and updated if appropriate.  Patient Active Problem List   Diagnosis Date Noted  . Reactive depression 05/14/2016  . Hashimoto's thyroiditis 09/01/2015  . 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 Palpitations 06/20/2015  . Food impaction of esophagus 04/25/2014  . Esophageal stricture 04/25/2014  . Sleep disorder 12/21/2013  . Anxiety 01/22/2009  . PROSTATE CANCER, HX OF 01/11/2009    Current Outpatient Medications on File Prior to Visit  Medication Sig Dispense Refill  . acetaminophen (TYLENOL) 500 MG tablet Take 500 mg by mouth at bedtime as needed.    . clonazePAM (KLONOPIN) 0.5 MG  tablet Take 1 tablet (0.5 mg total) by mouth at bedtime. Can take one tablet during the day if needed. MDD 2 60 tablet 5  . Multiple Vitamins-Minerals (MULTIVITAMIN ADULT PO) Take by mouth 1 day or 1 dose.    [name] omeprazole (PRILOSEC) 20 MG capsule TAKE 1 CAPSULE EVERY DAY 90 capsule 0  . sertraline (ZOLOFT) 100 MG tablet Take 1 tablet (100 mg total) by mouth daily. 90 tablet 1   No current facility-administered medications on file prior to visit.     Past Medical History:  Diagnosis Date  . Anxiety   . Esophageal fissure   . Esophageal stricture   . GERD (gastroesophageal reflux disease)   . History of prostate cancer 11/20/2008   Dr Alinda Money  . Internal hemorrhoids   . Pneumonia 01/2009   pleural effusion tapped    Past Surgical History:  Procedure Laterality Date  . COLONOSCOPY  04/2008   Dr Sharlett Iles  . ESOPHAGOGASTRODUODENOSCOPY N/A 04/25/2014   Procedure: ESOPHAGOGASTRODUODENOSCOPY (EGD);  Surgeon: Irene Shipper, MD;  Location: [facility];  Service: Endoscopy;  Laterality: N/A;  . PROSTATECTOMY  11/20/2008   Dr Dutch Gray  . REFRACTIVE SURGERY  2007   for Retina Dethachment, Dr Jalene Mullet , [facility]  . TONSILLECTOMY      Social History   Socioeconomic History  . Marital status: Single    Spouse name: None  . Number of children: None  . Years of  education: None  . Highest education level: None  Social Needs  . Financial resource strain: None  . Food insecurity - worry: None  . Food insecurity - inability: None  . Transportation needs - medical: None  . Transportation needs - non-medical: None  Occupational History  . Occupation: Lobbyist  Tobacco Use  . Smoking status: Never Smoker  . Smokeless tobacco: Never Used  Substance and Sexual Activity  . Alcohol use: Yes    Alcohol/week: 0.6 oz    Types: 1 Cans of beer per week    Comment: Socially  . Drug use: No  . Sexual activity: None  Other Topics Concern  . None  Social History Narrative   Exercise:  walking regularly    Family History  Problem Relation Age of Onset  . Dementia Mother   . Hypertension Mother   . Transient ischemic attack Mother        > 49  . Cervical cancer Mother   . Prostate cancer Father 1        metastatic to bones, lungs & cns  . Breast cancer Sister        <50  . Diabetes Maternal Aunt   . Heart disease Neg Hx   . Colon cancer Neg Hx   . Stomach cancer Neg Hx     Review of Systems  Constitutional: Negative for chills, fatigue and fever.  HENT: Positive for trouble swallowing (mild).   Respiratory: Positive for shortness of breath (with panic). Negative for cough and wheezing.   Cardiovascular: Positive for palpitations (with panic). Negative for chest pain and leg swelling.  Gastrointestinal: Negative for abdominal pain and nausea.       No gerd  Neurological: Positive for light-headedness (with panic attacks). Negative for headaches.  Psychiatric/Behavioral: Positive for dysphoric mood. The patient is nervous/anxious.        Objective:   Vitals:   12/24/16 0834  BP: 128/80  Pulse: 62  Resp: 16  Temp: 98 F (36.7 C)  SpO2: 97%   Wt Readings from Last 3 Encounters:  12/24/16 205 lb (93 kg)  06/25/16 186 lb (84.4 kg)  05/14/16 194 lb (88 kg)   Body mass index is 25.62 kg/m.   Physical Exam    Constitutional: Appears well-developed and well-nourished. No distress.  HENT:  Head: Normocephalic and atraumatic.  Neck: Neck supple. No tracheal deviation present. No thyromegaly present.  No cervical lymphadenopathy Cardiovascular: Normal rate, regular rhythm and normal heart sounds.   No murmur heard. No carotid bruit .  No edema Pulmonary/Chest: Effort normal and breath sounds normal. No respiratory distress. No has no wheezes. No rales.  Skin: Skin is warm and dry. Not diaphoretic.  Psychiatric: Normal mood and affect. Behavior is normal.      Assessment & Plan:    See Problem List for Assessment and Plan of chronic medical  problems.

## 2016-12-24 ENCOUNTER — Ambulatory Visit (INDEPENDENT_AMBULATORY_CARE_PROVIDER_SITE_OTHER): Payer: BLUE CROSS/BLUE SHIELD | Admitting: Internal Medicine

## 2016-12-24 ENCOUNTER — Encounter: Payer: Self-pay | Admitting: Internal Medicine

## 2016-12-24 VITALS — BP 128/80 | HR 62 | Temp 98.0°F | Resp 16 | Ht 75.0 in | Wt 205.0 lb

## 2016-12-24 DIAGNOSIS — K222 Esophageal obstruction: Secondary | ICD-10-CM

## 2016-12-24 DIAGNOSIS — F419 Anxiety disorder, unspecified: Secondary | ICD-10-CM

## 2016-12-24 DIAGNOSIS — F329 Major depressive disorder, single episode, unspecified: Secondary | ICD-10-CM

## 2016-12-24 DIAGNOSIS — E063 Autoimmune thyroiditis: Secondary | ICD-10-CM | POA: Diagnosis not present

## 2016-12-24 DIAGNOSIS — G479 Sleep disorder, unspecified: Secondary | ICD-10-CM

## 2016-12-24 NOTE — Assessment & Plan Note (Signed)
Controlled, stable Continue current dose of medication  

## 2016-12-24 NOTE — Assessment & Plan Note (Signed)
Check TFTs Thyroid antibody positive in the past, but last TSH normal Having some fatigue, but this may be related to not exercising regularly

## 2016-12-24 NOTE — Assessment & Plan Note (Signed)
Controlled with clonazepam nightly He has been on this for years and denies any side effects This is helping to control his anxiety as well We will continue her current dose

## 2016-12-24 NOTE — Assessment & Plan Note (Signed)
Generalized anxiety fairly controlled, but he has had a couple of recent panic attacks Continue sertraline at current dose Takes clonazepam at bedtime, but advised that he can take a dose during the day on a rare occasion if needed Follow-up if panic attacks increase

## 2016-12-24 NOTE — Assessment & Plan Note (Signed)
Recently he has been having some mild dysphagia, but hopefully this is related to him eating too fast and taking too big of bites-he will adjust how he eats If symptoms persist or worsen he will see GI Continue omeprazole daily

## 2017-01-02 ENCOUNTER — Other Ambulatory Visit: Payer: Self-pay | Admitting: Internal Medicine

## 2017-01-09 ENCOUNTER — Other Ambulatory Visit: Payer: Self-pay | Admitting: Internal Medicine

## 2017-01-16 ENCOUNTER — Other Ambulatory Visit: Payer: Self-pay | Admitting: Internal Medicine

## 2017-01-16 NOTE — Telephone Encounter (Signed)
[place] Controlled Substance Database checked. Last filled on 11/20/16

## 2017-02-01 ENCOUNTER — Other Ambulatory Visit: Payer: Self-pay | Admitting: Internal Medicine

## 2017-03-11 ENCOUNTER — Other Ambulatory Visit: Payer: Self-pay | Admitting: Internal Medicine

## 2017-03-12 ENCOUNTER — Other Ambulatory Visit: Payer: Self-pay | Admitting: Internal Medicine

## 2017-03-13 ENCOUNTER — Telehealth: Payer: Self-pay | Admitting: Internal Medicine

## 2017-03-17 ENCOUNTER — Other Ambulatory Visit: Payer: Self-pay | Admitting: Internal Medicine

## 2017-03-18 NOTE — Telephone Encounter (Signed)
Patient states he needs medication omeprazole refilled at [facility]. Pt scheduled for appt on 4.24.19.

## 2017-03-19 MED ORDER — OMEPRAZOLE 20 MG PO CPDR: DELAYED_RELEASE_CAPSULE | ORAL | 3 refills | Status: DC

## 2017-03-19 NOTE — Telephone Encounter (Signed)
Refilled Omeprazole 

## 2017-03-22 IMAGING — RF DG ESOPHAGUS
11 of 20 series · 12 of 24 positions shown · non-contrast
Comparison: None.

CLINICAL DATA: Status post EGD for removal of acute food bolus.
Evaluate for tear

EXAM:
ESOPHOGRAM/BARIUM SWALLOW
TECHNIQUE: Single contrast examination was performed using isotonic
water-soluble contrast.
FLUOROSCOPY TIME:  Radiation Exposure Index (as provided by the
fluoroscopic device): Not available on this device
If the device does not provide the exposure index:
Fluoroscopy Time:  2 minutes 13 seconds
Number of Acquired Images:  20 series

[Series 2: run · 1 of 1 slices shown (1 of 11)]
[im 1/1]
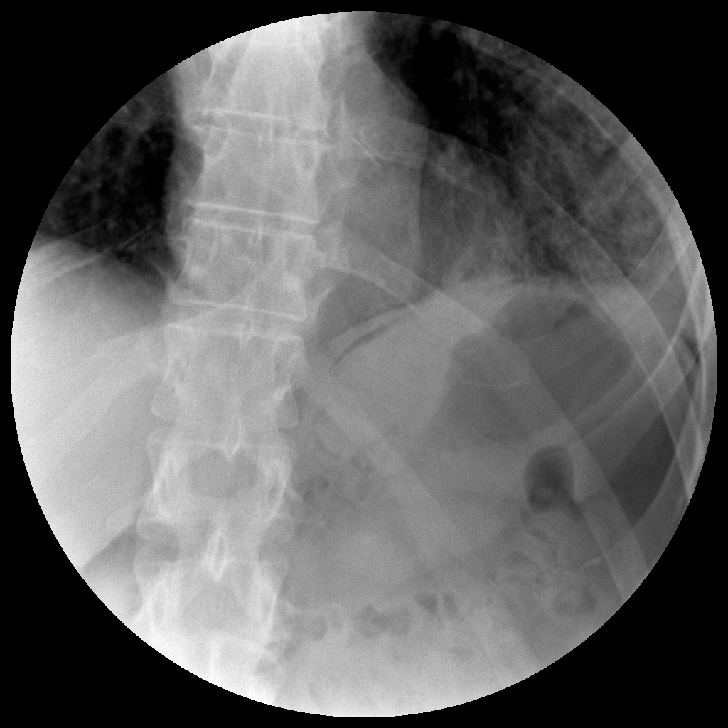

[Series 4: run · 1 of 1 slices shown (2 of 11)]
[im 1/1]
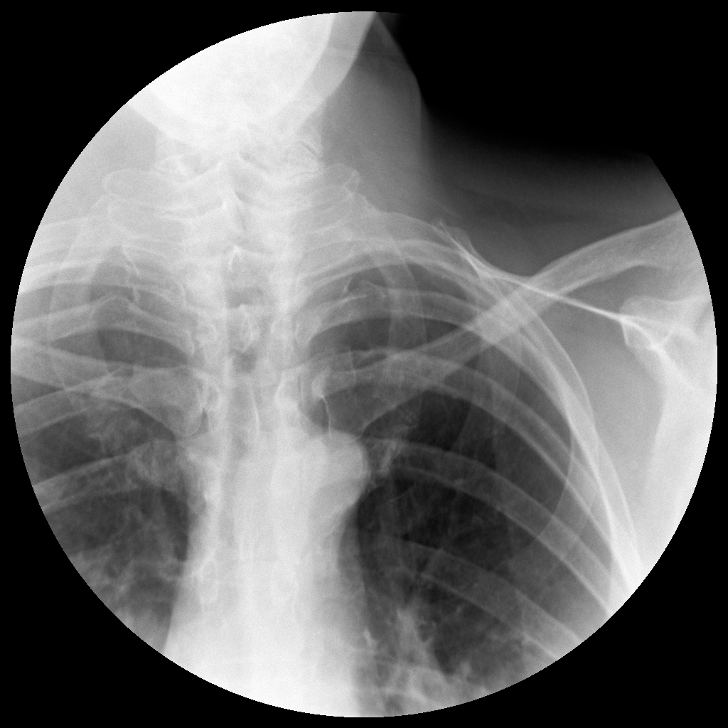

[Series 5: run · 2 of 46 slices shown (3 of 11)]
[im 12/46]
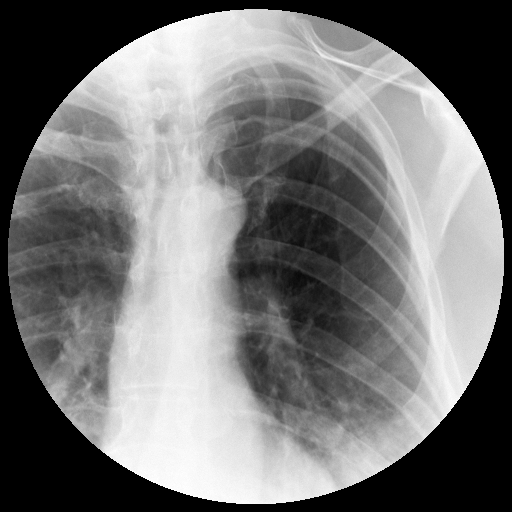
[im 34/46]
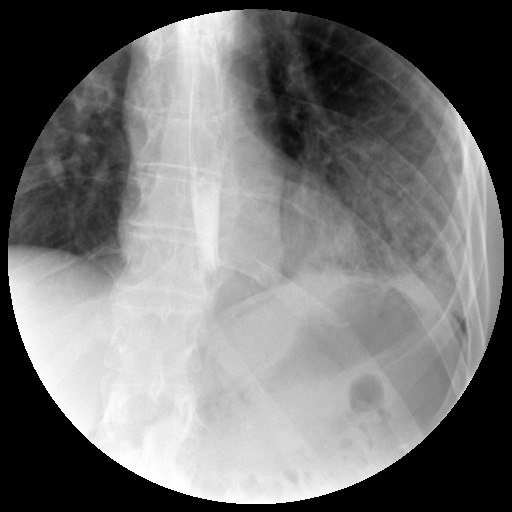

[Series 6: run · 1 of 20 slices shown (4 of 11)]
[im 1/20]
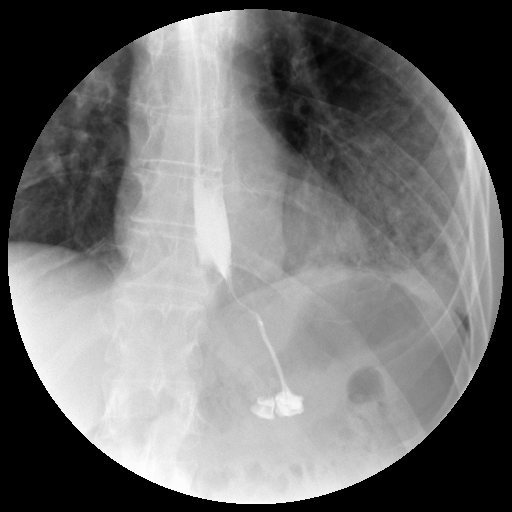

[Series 8: run · 1 of 6 slices shown (5 of 11)]
[im 1/6]
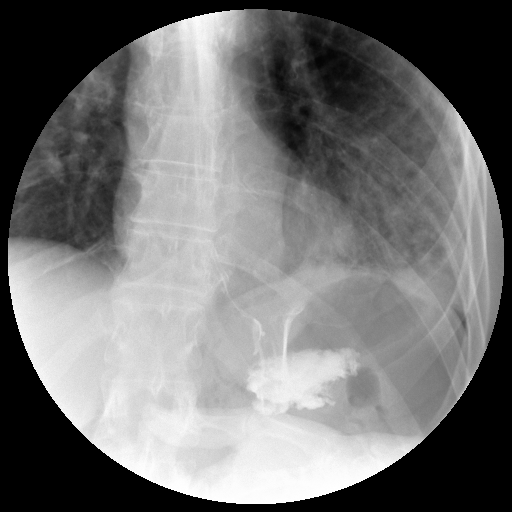

[Series 10: run · 1 of 1 slices shown (6 of 11)]
[im 1/1]
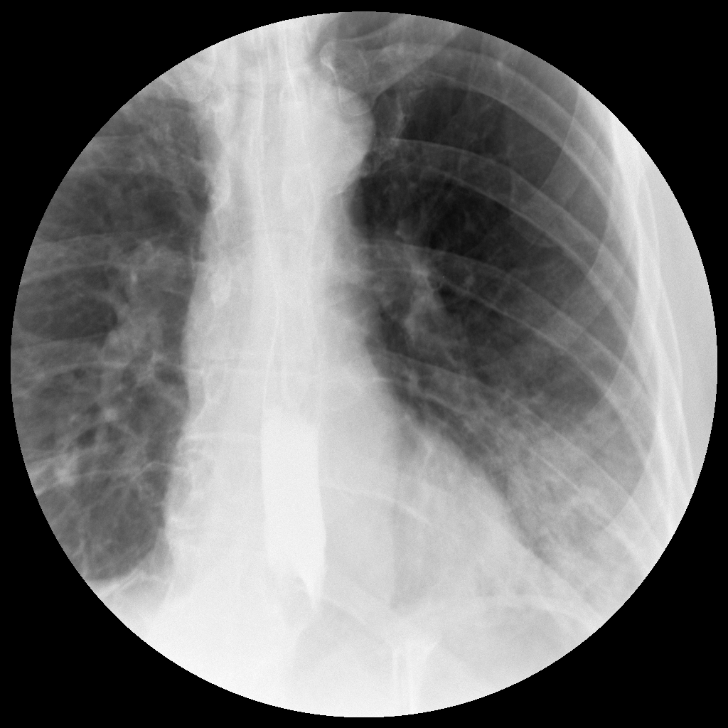

[Series 12: run · 1 of 1 slices shown (7 of 11)]
[im 1/1]
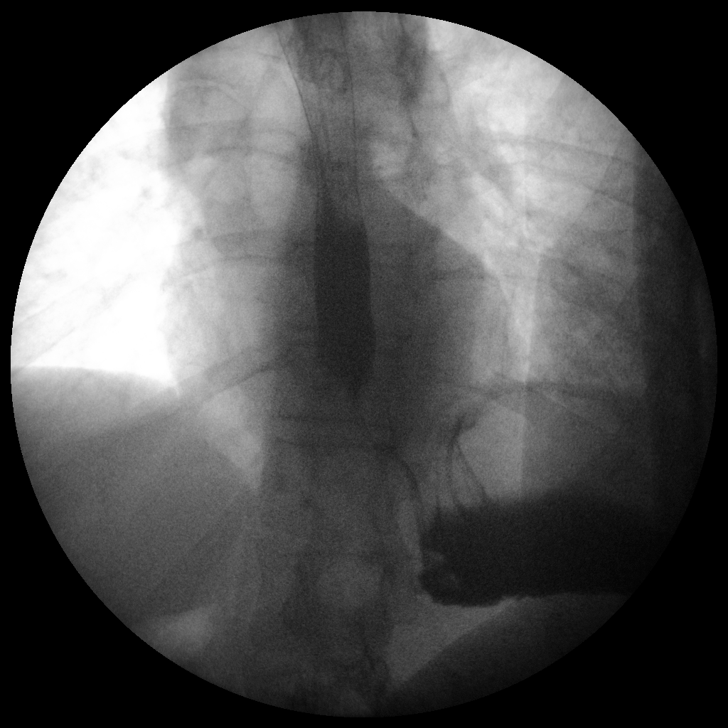

[Series 14: run · 1 of 1 slices shown (8 of 11)]
[im 1/1]
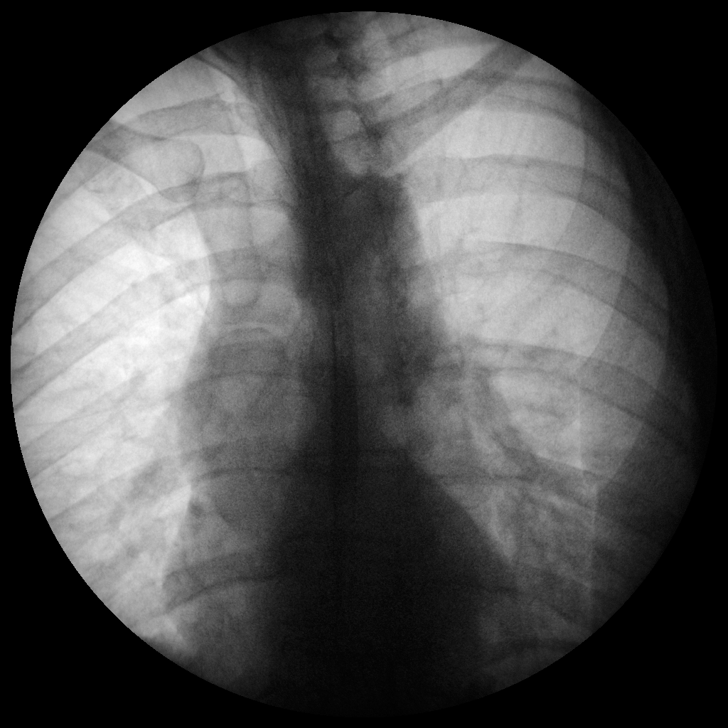

[Series 16: run · 1 of 1 slices shown (9 of 11)]
[im 1/1]
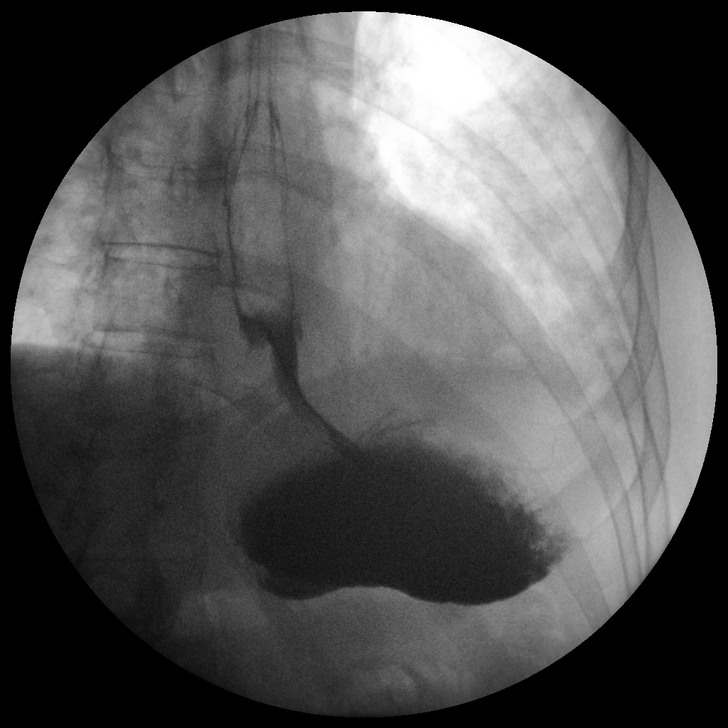

[Series 18: run · 1 of 1 slices shown (10 of 11)]
[im 1/1]
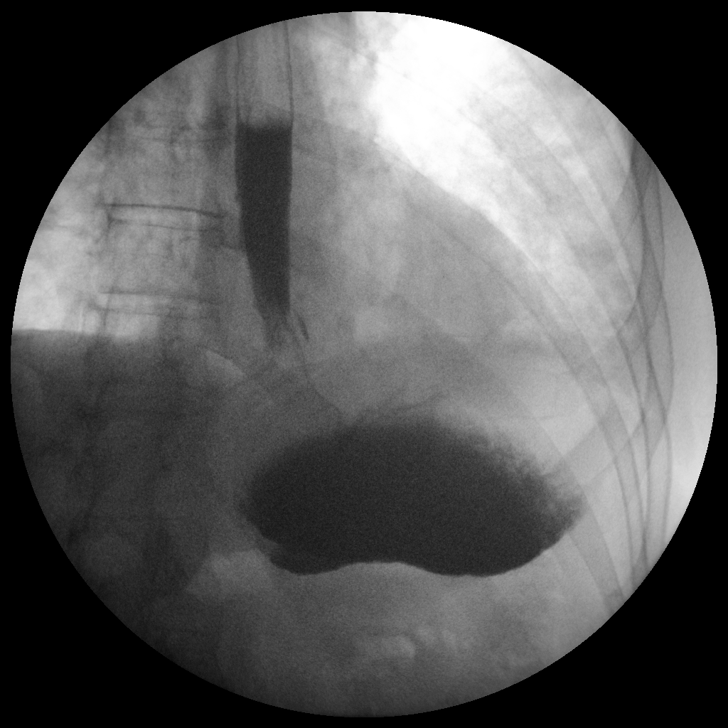

[Series 20: run · 1 of 1 slices shown (11 of 11)]
[im 1/1]
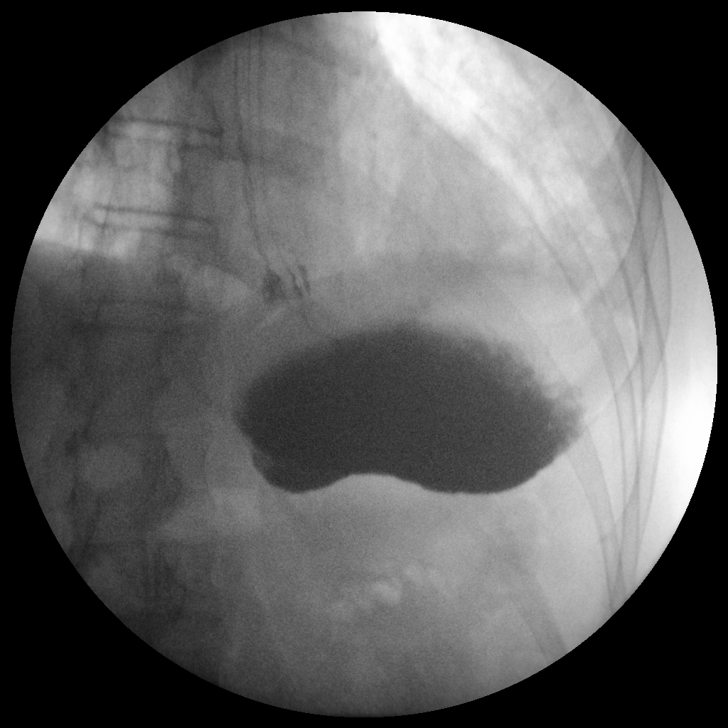

[12 of 24 positions shown; findings below may reference images not displayed]

FINDINGS: [facility] imaging was performed. There was notable opacification at the
left base which is new from 04/25/2014 chest x-ray. There is also
new this linear gas outlining the proximal stomach. No gas under the
right diaphragm to suggest free pneumoperitoneum.

Esophagram notable for deep laceration/fissure in the posterior
esophagus, distal third, extending to the GE junction. Contrast
within this fissure cleared, and there was no extra luminal contrast
to suggest a transmural esophageal leak. No proximal gastric leak
encountered.

These results were called by telephone at the time of interpretation
on 04/26/2014 at [DATE] to Dr. Nomasibulele, who verbally acknowledged
these results.
IMPRESSION: 1. Long deep fissure in the distal esophagus. No extraluminal
contrast leakage/perforation.
2. Left lower lobe aspiration pneumonitis.
3. Proximal gastric pneumatosis.

## 2017-03-23 IMAGING — CR DG CHEST 2V
2 series · 2 of 2 positions shown · non-contrast
Comparison: 04/25/2014.

CLINICAL DATA: Impacted food removed endoscopically 3 days ago.
Clinical concern for aspiration.

EXAM:
CHEST  2 VIEW

[view not recorded (1 of 2)]
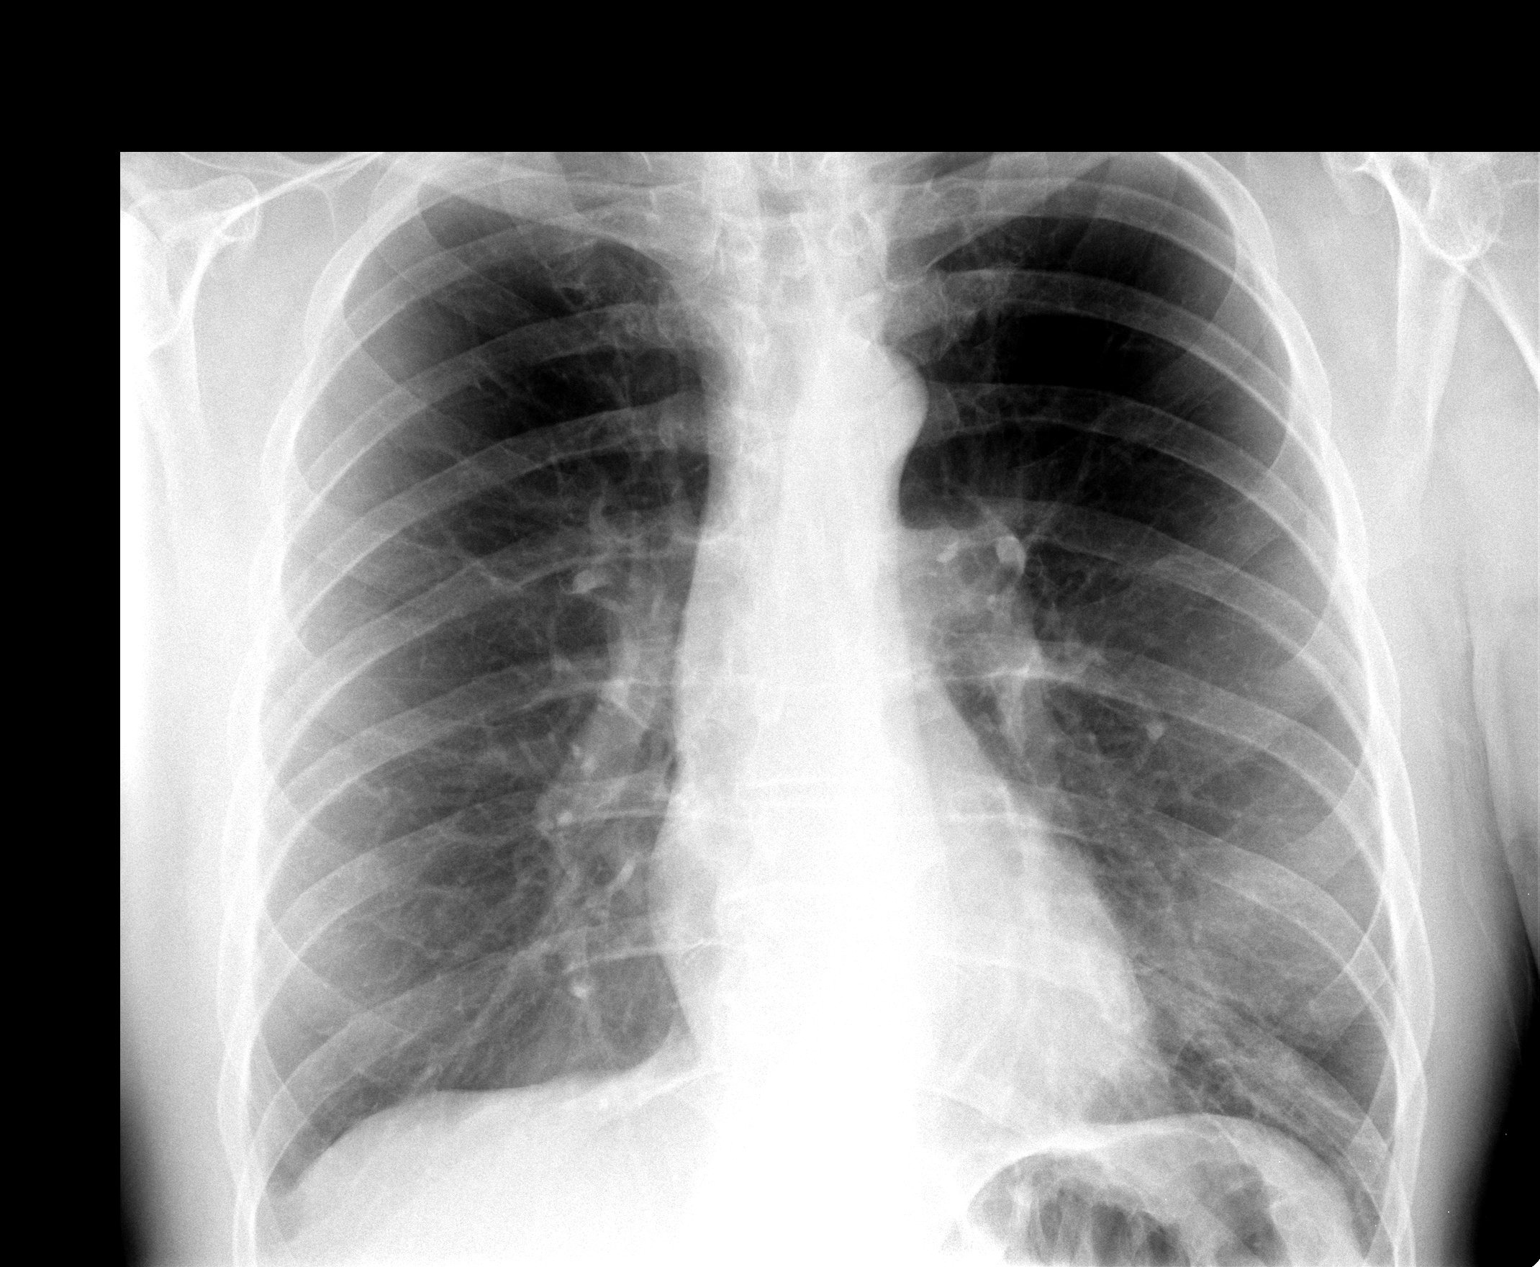

[view not recorded (2 of 2)]
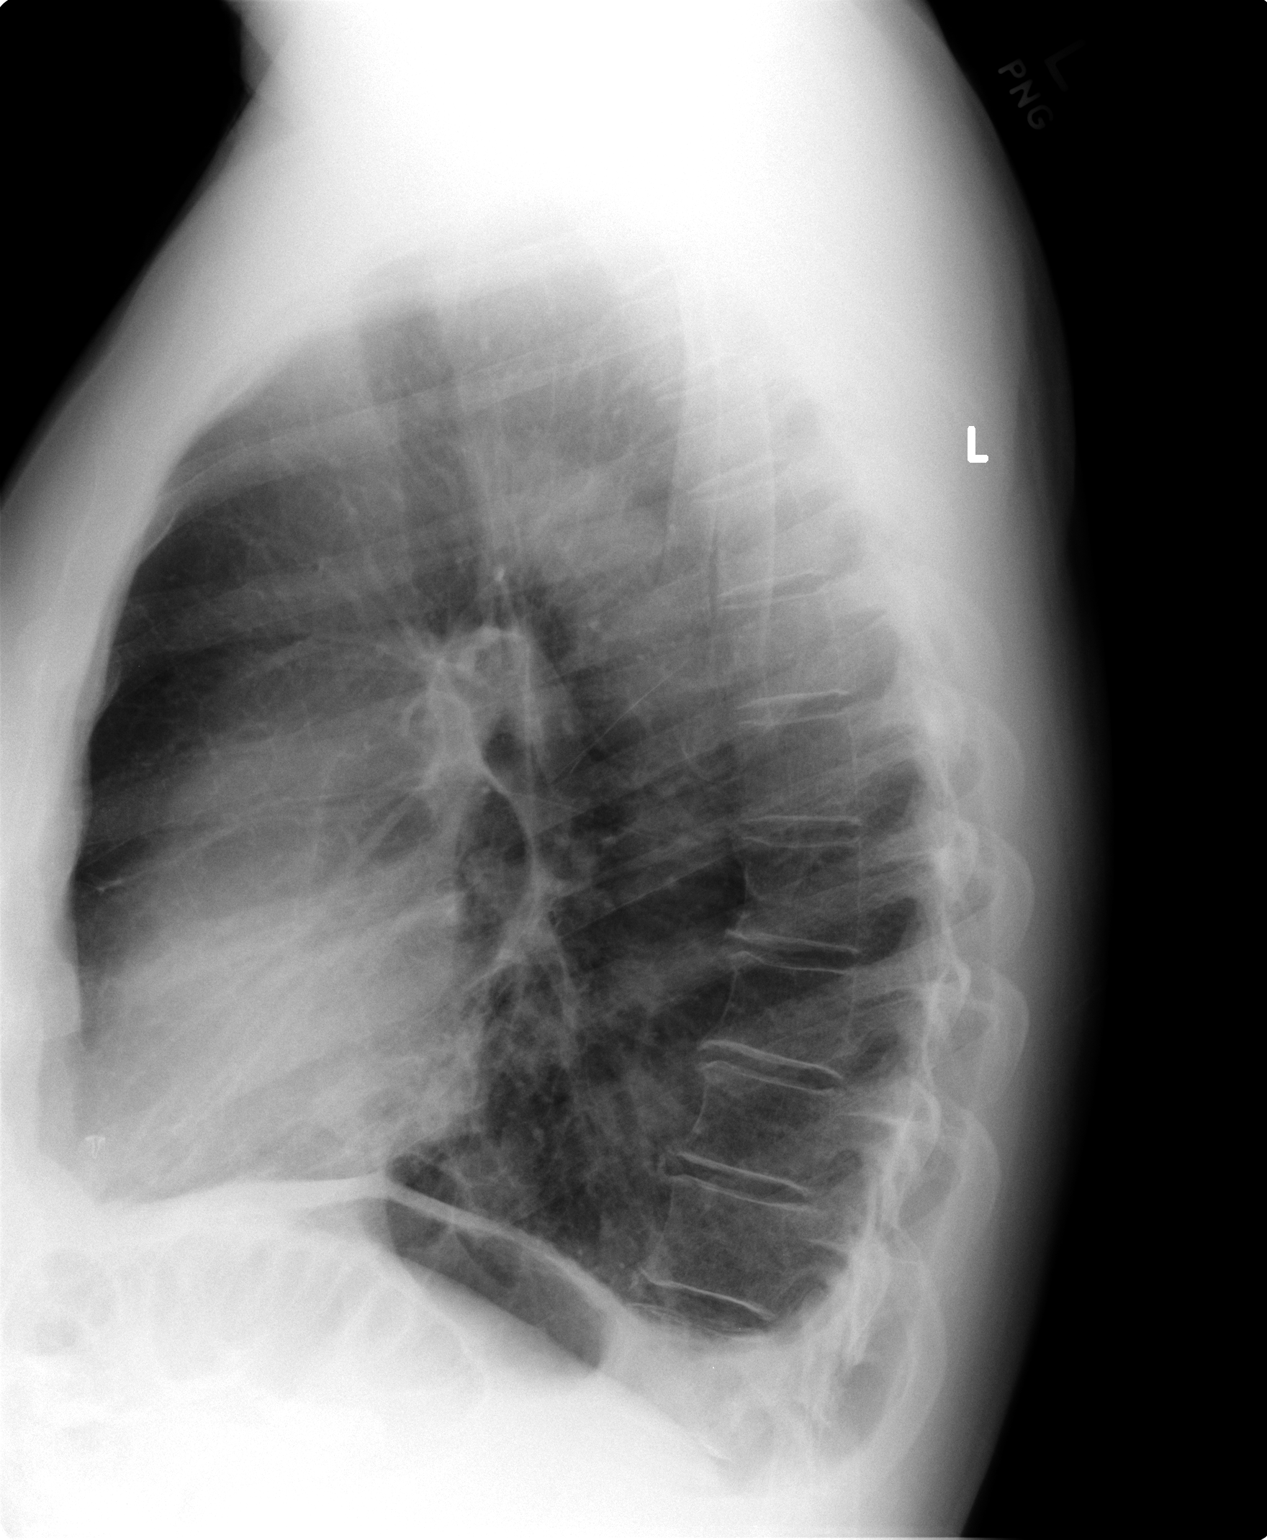

[2 of 2 positions shown; findings below may reference images not displayed]

FINDINGS: Normal sized heart. Clear lungs. The lungs remain mildly
hyperexpanded with mild central peribronchial thickening. Interval
small bilateral pleural effusions. Mild thoracic spine degenerative
changes.
IMPRESSION: 1. Interval small bilateral pleural effusions.
2. Stable mild changes of COPD and chronic bronchitis.

## 2017-04-29 ENCOUNTER — Encounter: Payer: Self-pay | Admitting: Internal Medicine

## 2017-04-29 ENCOUNTER — Ambulatory Visit: Payer: BLUE CROSS/BLUE SHIELD | Admitting: Internal Medicine

## 2017-04-29 VITALS — BP 116/68 | HR 75 | Ht 75.0 in | Wt 203.5 lb

## 2017-04-29 DIAGNOSIS — K222 Esophageal obstruction: Secondary | ICD-10-CM | POA: Diagnosis not present

## 2017-04-29 DIAGNOSIS — K219 Gastro-esophageal reflux disease without esophagitis: Secondary | ICD-10-CM | POA: Diagnosis not present

## 2017-04-29 MED ORDER — OMEPRAZOLE 20 MG PO CPDR: DELAYED_RELEASE_CAPSULE | ORAL | 11 refills | Status: DC

## 2017-04-29 NOTE — Patient Instructions (Signed)
If you are age 67 or older, your body mass index should be between 23-30. Your Body mass index is 25.44 kg/m. If this is out of the aforementioned range listed, please consider follow up with your Primary Care Provider.  If you are age 19 or younger, your body mass index should be between 19-25. Your Body mass index is 25.44 kg/m. If this is out of the aformentioned range listed, please consider follow up with your Primary Care Provider.   Please follow up in 1 year.   Thank you for choosing [facility]

## 2017-04-29 NOTE — Progress Notes (Signed)
HISTORY OF PRESENT ILLNESS:  Caleb Jones is a 67 y.o. male with a history of prostate cancer, anxiety/depression, and GERD complicated by esophageal stricture with severe food impaction (April 2016) and associated esophageal mucosal laceration without perforation and associated aspiration pneumonitis.he subsequently underwent upper endoscopy after healing of his laceration. Last upper endoscopy performed 09/27/2014. Dilated to a maximal diameter of 19 mm. Has been maintained on omeprazole 20 mg daily. He presents today for follow-up. He continues on omeprazole 20 mg daily. No recurrent dysphagia. No medication side effects. Last colonoscopy 9 years ago. Was negative. GI review of systems is otherwise negative.  REVIEW OF SYSTEMS:  All non-GI ROS negative unless otherwise stated in the history of present illness except for anxiety, depression, sinus and allergy, irregular heartbeats  Past Medical History:  Diagnosis Date  . Anxiety   . Esophageal fissure   . Esophageal stricture   . GERD (gastroesophageal reflux disease)   . History of prostate cancer 11/20/2008   Dr Alinda Money  . Internal hemorrhoids   . Pneumonia 01/2009   pleural effusion tapped    Past Surgical History:  Procedure Laterality Date  . COLONOSCOPY  04/2008   Dr Sharlett Iles  . ESOPHAGOGASTRODUODENOSCOPY N/A 04/25/2014   Procedure: ESOPHAGOGASTRODUODENOSCOPY (EGD);  Surgeon: Irene Shipper, MD;  Location: [facility];  Service: Endoscopy;  Laterality: N/A;  . PROSTATECTOMY  11/20/2008   Dr Dutch Gray  . REFRACTIVE SURGERY  2007   for Retina Dethachment, Dr Jalene Mullet , [facility]  . TONSILLECTOMY      Social History Rogers Ditter  reports that he has never smoked. He has never used smokeless tobacco. He reports that he drinks about 0.6 oz of alcohol per week. He reports that he does not use drugs.  family history includes Breast cancer in his sister; Cervical cancer in his mother; Dementia in his mother; Diabetes in his  maternal aunt; Hypertension in his mother; Prostate cancer (age of onset: 64) in his father; Transient ischemic attack in his mother.  No Known Allergies     PHYSICAL EXAMINATION: Vital signs: BP 116/68   Pulse 75   Ht 6\' 3"  (1.905 m)   Wt 203 lb 8 oz (92.3 kg)   BMI 25.44 kg/m   Constitutional: generally well-appearing, no acute distress Psychiatric: alert and oriented x3, cooperative Eyes: extraocular movements intact, anicteric, conjunctiva pink Mouth: oral pharynx moist, no lesions Neck: supple no lymphadenopathy Cardiovascular: heart regular rate and rhythm, no murmur Lungs: clear to auscultation bilaterally Abdomen: soft, nontender, nondistended, no obvious ascites, no peritoneal signs, normal bowel sounds, no organomegaly Rectal:omitted Extremities: no  Clubbing, cyanosis, or lower extremity edema bilaterally Skin: no lesions on visible extremities Neuro: No focal deficits. Cranial nerves intact  ASSESSMENT:  #1. GERD, complicated by peptic stricture with a history of food impaction #2. Negative screening colonoscopy 2010   PLAN:  #1. Reflux precautions #2. Continue omeprazole 20 mg daily. Prescribed #3. Routine GI office follow-up one year. Contact the office in the interim for questions or problems with recurrent dysphagia #4. Repeat screening colonoscopy next year. He is aware

## 2017-06-23 NOTE — Progress Notes (Signed)
Subjective:    Patient ID: Caleb Jones, male    DOB: Mar 20, 1950, 67 y.o.   MRN: [id number]  HPI The patient is here for follow up.  Anxiety, sleep disorder: He is taking sertraline at bedtime as prescribed.  He takes the clonazepam at bedtime. Occasionally he will take 1/2 of a pill in the morning.  He denies any side effects from the medication. He feels his anxiety is well controlled for the most part.  At times his stress level is higher than others.  He is happy with his current dose of medication.   Depression: He is taking his sertraline daily as prescribed. He denies any side effects from the medication. He feels his depression is well controlled and he is happy with his current dose of medication.   Thyroid Ab positive - hasimoto's thyroiditis:  His most recent tsh was normal.  He is not currently on any medication.  He has been more fatigued recently - last couple of weeks.  He has also gained some weight.  His exercising has slowed down and his eating is worse.    Medications and allergies reviewed with patient and updated if appropriate.  Patient Active Problem List   Diagnosis Date Noted  . Reactive depression 05/14/2016  . Hashimoto's thyroiditis 09/01/2015  . 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 Palpitations 06/20/2015  . Food impaction of esophagus 04/25/2014  . Esophageal stricture 04/25/2014  . Sleep disorder 12/21/2013  . Anxiety 01/22/2009  . PROSTATE CANCER, HX OF 01/11/2009    Current Outpatient Medications on File Prior to Visit  Medication Sig Dispense Refill  . acetaminophen (TYLENOL) 500 MG tablet Take 500 mg by mouth at bedtime as needed.    . clonazePAM (KLONOPIN) 0.5 MG tablet TAKE 1 TABLET BY MOUTH AT BEDTIME CAN TAKE ONE TABLET DURING THE DAY IF NEEDED 60 tablet 2  . Multiple Vitamins-Minerals (MULTIVITAMIN ADULT PO) Take by mouth 1 day or 1 dose.    [name] omeprazole (PRILOSEC) 20 MG capsule TAKE 1 CAPSULE BY MOUTH EVERY DAY 30 capsule 11  . sertraline (ZOLOFT)  100 MG tablet TAKE 1 TABLET BY MOUTH EVERY DAY 90 tablet 1   No current facility-administered medications on file prior to visit.     Past Medical History:  Diagnosis Date  . Anxiety   . Esophageal fissure   . Esophageal stricture   . GERD (gastroesophageal reflux disease)   . History of prostate cancer 11/20/2008   Dr Alinda Money  . Internal hemorrhoids   . Pneumonia 01/2009   pleural effusion tapped    Past Surgical History:  Procedure Laterality Date  . COLONOSCOPY  04/2008   Dr Sharlett Iles  . ESOPHAGOGASTRODUODENOSCOPY N/A 04/25/2014   Procedure: ESOPHAGOGASTRODUODENOSCOPY (EGD);  Surgeon: Irene Shipper, MD;  Location: [facility];  Service: Endoscopy;  Laterality: N/A;  . PROSTATECTOMY  11/20/2008   Dr Dutch Gray  . REFRACTIVE SURGERY  2007   for Retina Dethachment, Dr Jalene Mullet , [facility]  . TONSILLECTOMY      Social History   Socioeconomic History  . Marital status: Single    Spouse name: Not on file  . Number of children: Not on file  . Years of education: Not on file  . Highest education level: Not on file  Occupational History  . Occupation: Museum/gallery exhibitions officer  . Financial resource strain: Not on file  . Food insecurity:    Worry: Not on file    Inability: Not on file  . Transportation needs:  Medical: Not on file    Non-medical: Not on file  Tobacco Use  . Smoking status: Never Smoker  . Smokeless tobacco: Never Used  Substance and Sexual Activity  . Alcohol use: Yes    Alcohol/week: 0.6 oz    Types: 1 Cans of beer per week    Comment: Socially  . Drug use: No  . Sexual activity: Not on file  Lifestyle  . Physical activity:    Days per week: Not on file    Minutes per session: Not on file  . Stress: Not on file  Relationships  . Social connections:    Talks on phone: Not on file    Gets together: Not on file    Attends religious service: Not on file    Active member of club or organization: Not on file    Attends meetings of clubs or  organizations: Not on file    Relationship status: Not on file  Other Topics Concern  . Not on file  Social History Narrative   Exercise: walking regularly    Family History  Problem Relation Age of Onset  . Dementia Mother   . Hypertension Mother   . Transient ischemic attack Mother        > 2  . Cervical cancer Mother   . Prostate cancer Father 67        metastatic to bones, lungs & cns  . Breast cancer Sister        <50  . Diabetes Maternal Aunt   . Heart disease Neg Hx   . Colon cancer Neg Hx   . Stomach cancer Neg Hx     Review of Systems  Constitutional: Positive for fatigue (last two weeks). Negative for chills and fever.  Respiratory: Negative for cough, shortness of breath and wheezing.   Cardiovascular: Positive for palpitations (occasional). Negative for chest pain and leg swelling.  Neurological: Positive for light-headedness (with changes in position at times) and headaches (frequent due to dry eyes).       Objective:   Vitals:   06/24/17 0833  BP: 116/80  Pulse: 65  Resp: 16  Temp: 98.3 F (36.8 C)  SpO2: 97%   BP Readings from Last 3 Encounters:  06/24/17 116/80  04/29/17 116/68  12/24/16 128/80   Wt Readings from Last 3 Encounters:  06/24/17 205 lb (93 kg)  04/29/17 203 lb 8 oz (92.3 kg)  12/24/16 205 lb (93 kg)   Body mass index is 25.62 kg/m.   Physical Exam    Constitutional: Appears well-developed and well-nourished. No distress.  HENT:  Head: Normocephalic and atraumatic.  Neck: Neck supple. No tracheal deviation present. No thyromegaly present.  No cervical lymphadenopathy Cardiovascular: Normal rate, regular rhythm and normal heart sounds.   No murmur heard. No carotid bruit .  No edema Pulmonary/Chest: Effort normal and breath sounds normal. No respiratory distress. No has no wheezes. No rales.  Skin: Skin is warm and dry. Not diaphoretic.  Psychiatric: Normal mood and affect. Behavior is normal.      Assessment & Plan:      See Problem List for Assessment and Plan of chronic medical problems.

## 2017-06-23 NOTE — Patient Instructions (Addendum)

## 2017-06-24 ENCOUNTER — Other Ambulatory Visit (INDEPENDENT_AMBULATORY_CARE_PROVIDER_SITE_OTHER): Payer: BLUE CROSS/BLUE SHIELD

## 2017-06-24 ENCOUNTER — Encounter: Payer: Self-pay | Admitting: Internal Medicine

## 2017-06-24 ENCOUNTER — Ambulatory Visit: Payer: BLUE CROSS/BLUE SHIELD | Admitting: Internal Medicine

## 2017-06-24 VITALS — BP 116/80 | HR 65 | Temp 98.3°F | Resp 16 | Wt 205.0 lb

## 2017-06-24 DIAGNOSIS — G479 Sleep disorder, unspecified: Secondary | ICD-10-CM

## 2017-06-24 DIAGNOSIS — E063 Autoimmune thyroiditis: Secondary | ICD-10-CM | POA: Diagnosis not present

## 2017-06-24 DIAGNOSIS — F329 Major depressive disorder, single episode, unspecified: Secondary | ICD-10-CM

## 2017-06-24 DIAGNOSIS — F419 Anxiety disorder, unspecified: Secondary | ICD-10-CM | POA: Diagnosis not present

## 2017-06-24 LAB — TSH: TSH: 3.15 u[IU]/mL (ref 0.35–4.50)

## 2017-06-24 LAB — T4, FREE: Free T4: 0.69 ng/dL (ref 0.60–1.60)

## 2017-06-24 LAB — T3, FREE: T3, Free: 3.3 pg/mL (ref 2.3–4.2)

## 2017-06-24 MED ORDER — SERTRALINE HCL 100 MG PO TABS: 100.0000 mg | ORAL_TABLET | Freq: Every day | ORAL | 1 refills | Status: DC

## 2017-06-24 MED ORDER — CLONAZEPAM 0.5 MG PO TABS: ORAL_TABLET | ORAL | 2 refills | Status: DC

## 2017-06-24 NOTE — Assessment & Plan Note (Signed)
Controlled, stable Continue current dose of medication  

## 2017-06-24 NOTE — Assessment & Plan Note (Signed)
Controlled with clonazepam - has been on it for years Will continue

## 2017-06-24 NOTE — Assessment & Plan Note (Addendum)
Controlled, stable Continue current dose of medication - sertraline 100 mg daily and clonazepam at night, ok to take 1/2 of a clonazepam as needed in the morning Stressed regular exercise

## 2017-06-24 NOTE — Assessment & Plan Note (Signed)
Thyroid Ab pos, tsh last time was normal, but on the high end of normal and has been elevated in the past Increased fatigue and some weight gain -subjective Recheck tfts Will consider starting levothyroxine 25 mcg

## 2017-06-25 ENCOUNTER — Encounter: Payer: Self-pay | Admitting: Internal Medicine

## 2017-06-25 LAB — THYROID ANTIBODIES
Thyroglobulin Ab: <1 IU/mL (ref <=1)
Thyroperoxidase Ab SerPl-aCnc: 12 IU/mL — ABNORMAL HIGH (ref <9)

## 2017-06-25 MED ORDER — LEVOTHYROXINE SODIUM 25 MCG PO CAPS: 25.0000 ug | ORAL_CAPSULE | Freq: Every day | ORAL | 5 refills | Status: DC

## 2017-10-17 ENCOUNTER — Other Ambulatory Visit: Payer: Self-pay | Admitting: Internal Medicine

## 2017-10-19 MED ORDER — CLONAZEPAM 0.5 MG PO TABS: ORAL_TABLET | ORAL | 2 refills | Status: DC

## 2017-10-19 NOTE — Telephone Encounter (Signed)
MD approved and sent electronically to [email or web address]  

## 2017-10-19 NOTE — Telephone Encounter (Signed)
Check [place] registry last filled 09/05/2017.Marland KitchenJohny Chess

## 2017-10-28 ENCOUNTER — Encounter: Payer: Self-pay | Admitting: Internal Medicine

## 2017-12-13 ENCOUNTER — Other Ambulatory Visit: Payer: Self-pay | Admitting: Internal Medicine

## 2017-12-23 NOTE — Progress Notes (Signed)
Subjective:    Patient ID: Caleb Jones, male    DOB: 03/31/1950, 67 y.o.   MRN: [id number]  HPI The patient is here for follow up.  Anxiety, sleep disorder:  He takes zoloft and clonazepam nightly.  He occasionally take 1/2 clonazepam in the morning.  He dneies side effects and feels the medications work well.  He is happy with his dose of medications.  Depression: He is taking his sertraline daily as prescribed. He denies any side effects from the medication. He feels his depression is well controlled and he is happy with his current dose of medication.   Hashimoto's Hypothyroidism:  He is taking his medication daily.  He denies any recent changes in energy or weight that are unexplained.    Medications and allergies reviewed with patient and updated if appropriate.  Patient Active Problem List   Diagnosis Date Noted  . Reactive depression 05/14/2016  . Hashimoto's thyroiditis 09/01/2015  . 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 Palpitations 06/20/2015  . Food impaction of esophagus 04/25/2014  . Esophageal stricture 04/25/2014  . Sleep disorder 12/21/2013  . Anxiety 01/22/2009  . PROSTATE CANCER, HX OF 01/11/2009    Current Outpatient Medications on File Prior to Visit  Medication Sig Dispense Refill  . acetaminophen (TYLENOL) 500 MG tablet Take 500 mg by mouth at bedtime as needed.    . clonazePAM (KLONOPIN) 0.5 MG tablet TAKE 1 TABLET BY MOUTH AT BEDTIME CAN TAKE ONE TABLET DURING THE DAY IF NEEDED (Patient taking differently: Take 1/2 tablet in the morning and TAKE 1 TABLET BY MOUTH AT BEDTIME CAN TAKE ONE TABLET DURING THE DAY IF NEEDED) 60 tablet 2  . levothyroxine (SYNTHROID, LEVOTHROID) 25 MCG tablet TAKE 1 TABLET EVERY DAY BEFORE BREAKFAST 90 tablet 1  . Multiple Vitamins-Minerals (MULTIVITAMIN ADULT PO) Take by mouth 1 day or 1 dose.    [name] omeprazole (PRILOSEC) 20 MG capsule TAKE 1 CAPSULE BY MOUTH EVERY DAY 30 capsule 11   No current facility-administered medications on file  prior to visit.     Past Medical History:  Diagnosis Date  . Anxiety   . Esophageal fissure   . Esophageal stricture   . GERD (gastroesophageal reflux disease)   . History of prostate cancer 11/20/2008   Dr Alinda Money  . Internal hemorrhoids   . Pneumonia 01/2009   pleural effusion tapped    Past Surgical History:  Procedure Laterality Date  . COLONOSCOPY  04/2008   Dr Sharlett Iles  . ESOPHAGOGASTRODUODENOSCOPY N/A 04/25/2014   Procedure: ESOPHAGOGASTRODUODENOSCOPY (EGD);  Surgeon: Irene Shipper, MD;  Location: [facility];  Service: Endoscopy;  Laterality: N/A;  . PROSTATECTOMY  11/20/2008   Dr Dutch Gray  . REFRACTIVE SURGERY  2007   for Retina Dethachment, Dr Jalene Mullet , [facility]  . TONSILLECTOMY      Social History   Socioeconomic History  . Marital status: Single    Spouse name: Not on file  . Number of children: Not on file  . Years of education: Not on file  . Highest education level: Not on file  Occupational History  . Occupation: Museum/gallery exhibitions officer  . Financial resource strain: Not on file  . Food insecurity:    Worry: Not on file    Inability: Not on file  . Transportation needs:    Medical: Not on file    Non-medical: Not on file  Tobacco Use  . Smoking status: Never Smoker  . Smokeless tobacco: Never Used  Substance and Sexual  Activity  . Alcohol use: Yes    Alcohol/week: 1.0 standard drinks    Types: 1 Cans of beer per week    Comment: Socially  . Drug use: No  . Sexual activity: Not on file  Lifestyle  . Physical activity:    Days per week: Not on file    Minutes per session: Not on file  . Stress: Not on file  Relationships  . Social connections:    Talks on phone: Not on file    Gets together: Not on file    Attends religious service: Not on file    Active member of club or organization: Not on file    Attends meetings of clubs or organizations: Not on file    Relationship status: Not on file  Other Topics Concern  . Not on file   Social History Narrative   Exercise: walking regularly    Family History  Problem Relation Age of Onset  . Dementia Mother   . Hypertension Mother   . Transient ischemic attack Mother        > 80  . Cervical cancer Mother   . Prostate cancer Father 62        metastatic to bones, lungs & cns  . Breast cancer Sister        <50  . Diabetes Maternal Aunt   . Heart disease Neg Hx   . Colon cancer Neg Hx   . Stomach cancer Neg Hx     Review of Systems  Constitutional: Negative for chills and fever.  Respiratory: Negative for cough, shortness of breath and wheezing.   Cardiovascular: Negative for chest pain, palpitations and leg swelling.  Neurological: Negative for light-headedness and headaches.       Objective:   Vitals:   12/24/17 0829  BP: 120/78  Pulse: 62  Resp: 16  Temp: 98.9 F (37.2 C)  SpO2: 95%   BP Readings from Last 3 Encounters:  12/24/17 120/78  06/24/17 116/80  04/29/17 116/68   Wt Readings from Last 3 Encounters:  12/24/17 209 lb (94.8 kg)  06/24/17 205 lb (93 kg)  04/29/17 203 lb 8 oz (92.3 kg)   Body mass index is 26.12 kg/m.   Physical Exam    Constitutional: Appears well-developed and well-nourished. No distress.  HENT:  Head: Normocephalic and atraumatic.  Neck: Neck supple. No tracheal deviation present. No thyromegaly present.  No cervical lymphadenopathy Cardiovascular: Normal rate, regular rhythm and normal heart sounds.   No murmur heard. No carotid bruit .  No edema Pulmonary/Chest: Effort normal and breath sounds normal. No respiratory distress. No has no wheezes. No rales.  Skin: Skin is warm and dry. Not diaphoretic.  Psychiatric: Normal mood and affect. Behavior is normal.      Assessment & Plan:    See Problem List for Assessment and Plan of chronic medical problems.

## 2017-12-24 ENCOUNTER — Other Ambulatory Visit (INDEPENDENT_AMBULATORY_CARE_PROVIDER_SITE_OTHER): Payer: 59

## 2017-12-24 ENCOUNTER — Encounter: Payer: Self-pay | Admitting: Internal Medicine

## 2017-12-24 ENCOUNTER — Ambulatory Visit: Payer: 59 | Admitting: Internal Medicine

## 2017-12-24 VITALS — BP 120/78 | HR 62 | Temp 98.9°F | Resp 16 | Ht 75.0 in | Wt 209.0 lb

## 2017-12-24 DIAGNOSIS — F419 Anxiety disorder, unspecified: Secondary | ICD-10-CM | POA: Diagnosis not present

## 2017-12-24 DIAGNOSIS — E063 Autoimmune thyroiditis: Secondary | ICD-10-CM

## 2017-12-24 DIAGNOSIS — G479 Sleep disorder, unspecified: Secondary | ICD-10-CM | POA: Diagnosis not present

## 2017-12-24 DIAGNOSIS — F329 Major depressive disorder, single episode, unspecified: Secondary | ICD-10-CM | POA: Diagnosis not present

## 2017-12-24 LAB — COMPREHENSIVE METABOLIC PANEL
ALT: 14 U/L (ref 0–53)
AST: 20 U/L (ref 0–37)
Albumin: 4.3 g/dL (ref 3.5–5.2)
Alkaline Phosphatase: 63 U/L (ref 39–117)
BUN: 9 mg/dL (ref 6–23)
CALCIUM: 9.3 mg/dL (ref 8.4–10.5)
CHLORIDE: 102 meq/L (ref 96–112)
CO2: 26 meq/L (ref 19–32)
Creatinine, Ser: 1.17 mg/dL (ref 0.40–1.50)
GFR: 65.98 mL/min (ref >60.00)
GLUCOSE: 90 mg/dL (ref 70–99)
Potassium: 4.5 mEq/L (ref 3.5–5.1)
Sodium: 137 mEq/L (ref 135–145)
Total Bilirubin: 0.4 mg/dL (ref 0.2–1.2)
Total Protein: 7.3 g/dL (ref 6.0–8.3)

## 2017-12-24 LAB — T3, FREE: T3 FREE: 3.3 pg/mL (ref 2.3–4.2)

## 2017-12-24 LAB — TSH: TSH: 2.63 u[IU]/mL (ref 0.35–4.50)

## 2017-12-24 LAB — T4, FREE: Free T4: 0.71 ng/dL (ref 0.60–1.60)

## 2017-12-24 MED ORDER — SERTRALINE HCL 100 MG PO TABS: 100.0000 mg | ORAL_TABLET | Freq: Every day | ORAL | 1 refills | Status: DC

## 2017-12-24 NOTE — Assessment & Plan Note (Signed)
Clinically euthyroid Check tsh  Titrate med dose if needed  

## 2017-12-24 NOTE — Assessment & Plan Note (Signed)
Controlled, stable Continue current dose of medication - sertraline

## 2017-12-24 NOTE — Assessment & Plan Note (Signed)
Controlled, stable Continue current dose of medication - sertraline daily, clonazepam 1/2 tab in morning and one tab at night

## 2017-12-24 NOTE — Assessment & Plan Note (Signed)
Controlled, stable Continue current dose of medication - clonazepam 0.5 mg nightly

## 2017-12-24 NOTE — Patient Instructions (Addendum)
  Tests ordered today. Your results will be released to MyChart (or called to you) after review, usually within 72hours after test completion. If any changes need to be made, you will be notified at that same time.   Medications reviewed and updated.  Changes include :  none   Your prescription(s) have been submitted to your pharmacy. Please take as directed and contact our office if you believe you are having problem(s) with the medication(s).    Please followup in 6 months for a physical   

## 2018-02-18 ENCOUNTER — Other Ambulatory Visit: Payer: Self-pay | Admitting: Internal Medicine

## 2018-02-18 NOTE — Telephone Encounter (Signed)
Last refill was 01/11/18 Last OV was 12/24/17 Next OV 06/29/18

## 2018-03-27 ENCOUNTER — Other Ambulatory Visit: Payer: Self-pay | Admitting: Internal Medicine

## 2018-04-06 ENCOUNTER — Encounter: Payer: Self-pay | Admitting: Internal Medicine

## 2018-04-06 MED ORDER — CLONAZEPAM 0.5 MG PO TABS: ORAL_TABLET | ORAL | 2 refills | Status: DC

## 2018-05-10 ENCOUNTER — Other Ambulatory Visit: Payer: Self-pay

## 2018-05-10 ENCOUNTER — Ambulatory Visit (INDEPENDENT_AMBULATORY_CARE_PROVIDER_SITE_OTHER): Payer: 59 | Admitting: Internal Medicine

## 2018-05-10 ENCOUNTER — Encounter: Payer: Self-pay | Admitting: Internal Medicine

## 2018-05-10 VITALS — Ht 75.0 in | Wt 200.0 lb

## 2018-05-10 DIAGNOSIS — K222 Esophageal obstruction: Secondary | ICD-10-CM | POA: Diagnosis not present

## 2018-05-10 DIAGNOSIS — K219 Gastro-esophageal reflux disease without esophagitis: Secondary | ICD-10-CM | POA: Diagnosis not present

## 2018-05-10 DIAGNOSIS — Z1211 Encounter for screening for malignant neoplasm of colon: Secondary | ICD-10-CM

## 2018-05-10 MED ORDER — OMEPRAZOLE 20 MG PO CPDR: DELAYED_RELEASE_CAPSULE | ORAL | 11 refills | Status: DC

## 2018-05-10 NOTE — Progress Notes (Signed)
HISTORY OF PRESENT ILLNESS:  Caleb Jones is a 68 y.o. male with a history of prostate cancer, anxiety/depression, and GERD complicated by peptic stricture with severe food impaction (April 2016).  He schedules this telehealth visit in the midst of the coronavirus pandemic regarding management of his chronic GERD, medication refill, and the need for endoscopic evaluations.  He was last seen in the office April 29, 2017.  He was asymptomatic at that time on PPI.  His last upper endoscopy with esophageal dilation was performed September 27, 2014.  Balloon dilation up to 19 mm.  Patient tells me that he continues on omeprazole 20 mg daily.  No reflux symptoms.  He does have occasional mild dysphasia but states he is extremely careful when he chews.  He drinks plenty of water.  His last colonoscopy with Dr. Sharlett Iles was performed May 2010.  The examination was normal.  Internal hemorrhoids noted.  Follow-up in 10 years recommended.  Patient's GI review of systems is unremarkable.  Blood work from December 2019 reveals unremarkable comprehensive metabolic panel.  Normal liver tests.  Review of x-ray file shows no relevant GI abnormalities.  REVIEW OF SYSTEMS:  All non-GI ROS negative unless otherwise stated in the HPI except for anxiety  Past Medical History:  Diagnosis Date  . Anxiety   . Esophageal fissure   . Esophageal stricture   . GERD (gastroesophageal reflux disease)   . History of prostate cancer 11/20/2008   Dr Alinda Money  . Internal hemorrhoids   . Pneumonia 01/2009   pleural effusion tapped    Past Surgical History:  Procedure Laterality Date  . COLONOSCOPY  04/2008   Dr Sharlett Iles  . ESOPHAGOGASTRODUODENOSCOPY N/A 04/25/2014   Procedure: ESOPHAGOGASTRODUODENOSCOPY (EGD);  Surgeon: Irene Shipper, MD;  Location: [facility];  Service: Endoscopy;  Laterality: N/A;  . PROSTATECTOMY  11/20/2008   Dr Dutch Gray  . REFRACTIVE SURGERY  2007   for Retina Dethachment, Dr Jalene Mullet , [facility]  .  TONSILLECTOMY      Social History Tahmir Kleckner  reports that he has never smoked. He has never used smokeless tobacco. He reports current alcohol use of about 1.0 standard drinks of alcohol per week. He reports that he does not use drugs.  family history includes Breast cancer in his sister; Cervical cancer in his mother; Dementia in his mother; Diabetes in his maternal aunt; Hypertension in his mother; Prostate cancer (age of onset: 25) in his father; Transient ischemic attack in his mother.  No Known Allergies     PHYSICAL EXAMINATION: No physical exam with telehealth visit    ASSESSMENT:  1.  GERD complicated by peptic stricture with severe food impaction.  Some mild recurrent dysphasia.  No classic reflux symptoms on PPI 2.  Normal colonoscopy May 2010.  Due for follow-up screening.  Motivated   PLAN:  1.  REFILL OMEPRAZOLE 20 mg daily; #30; 11 refills 2.  PLEASE SCHEDULE THE PATIENT FOR COLONOSCOPY for colon cancer screening and UPPER ENDOSCOPY with esophageal dilation for dysphasia.  Schedule with DR. PERRY in his next [place] session.The nature of the procedure, as well as the risks, benefits, and alternatives were carefully and thoroughly reviewed with the patient. Ample time for discussion and questions allowed. The patient understood, was satisfied, and agreed to proceed. The scheduled WebEx telehealth audiovisual visit was initiated by the patient and consented for by the patient.  Patient was in his home and I was in my office during the encounter.  He understands it may  be an associated charge for this professional service.

## 2018-05-11 ENCOUNTER — Telehealth: Payer: Self-pay | Admitting: Internal Medicine

## 2018-05-11 ENCOUNTER — Ambulatory Visit: Payer: 59 | Admitting: Internal Medicine

## 2018-05-11 MED ORDER — NA SULFATE-K SULFATE-MG SULF 17.5-3.13-1.6 GM/177ML PO SOLN: ORAL | 0 refills | Status: DC

## 2018-05-11 NOTE — Addendum Note (Signed)
Addended by: Candie Mile on: 05/11/2018 09:24 AM   Modules accepted: Orders

## 2018-05-11 NOTE — Telephone Encounter (Signed)
Pt called in requesting to speak with Peter Congo he stated that he was instructed by dr.perry during his televisit on yesterday to call and schedule a colonoscopy with gloria.

## 2018-05-21 ENCOUNTER — Telehealth: Payer: Self-pay | Admitting: *Deleted

## 2018-05-21 NOTE — Telephone Encounter (Signed)
No answer for second Covid screening attempt. Left message. SM

## 2018-05-21 NOTE — Telephone Encounter (Signed)
No answer for covid screening first attempt will call back. SM

## 2018-05-24 ENCOUNTER — Ambulatory Visit (AMBULATORY_SURGERY_CENTER): Payer: 59 | Admitting: Internal Medicine

## 2018-05-24 ENCOUNTER — Encounter: Payer: Self-pay | Admitting: Internal Medicine

## 2018-05-24 ENCOUNTER — Other Ambulatory Visit: Payer: Self-pay

## 2018-05-24 VITALS — BP 113/81 | HR 55 | Temp 98.6°F | Resp 17 | Ht 75.0 in | Wt 200.0 lb

## 2018-05-24 DIAGNOSIS — R1310 Dysphagia, unspecified: Secondary | ICD-10-CM

## 2018-05-24 DIAGNOSIS — Z1211 Encounter for screening for malignant neoplasm of colon: Secondary | ICD-10-CM | POA: Diagnosis not present

## 2018-05-24 DIAGNOSIS — D123 Benign neoplasm of transverse colon: Secondary | ICD-10-CM

## 2018-05-24 DIAGNOSIS — K219 Gastro-esophageal reflux disease without esophagitis: Secondary | ICD-10-CM

## 2018-05-24 DIAGNOSIS — D122 Benign neoplasm of ascending colon: Secondary | ICD-10-CM

## 2018-05-24 DIAGNOSIS — K222 Esophageal obstruction: Secondary | ICD-10-CM

## 2018-05-24 DIAGNOSIS — K635 Polyp of colon: Secondary | ICD-10-CM | POA: Diagnosis not present

## 2018-05-24 MED ORDER — SODIUM CHLORIDE 0.9 % IV SOLN: 500.0000 mL | Freq: Once | INTRAVENOUS | Status: DC

## 2018-05-24 NOTE — Patient Instructions (Addendum)
YOU HAD AN ENDOSCOPIC PROCEDURE TODAY AT [facility]:   Refer to the procedure report that was given to you for any specific questions about what was found during the examination.  If the procedure report does not answer your questions, please call your gastroenterologist to clarify.  If you requested that your care partner not be given the details of your procedure findings, then the procedure report has been included in a sealed envelope for you to review at your convenience later.  YOU SHOULD EXPECT: Some feelings of bloating in the abdomen. Passage of more gas than usual.  Walking can help get rid of the air that was put into your GI tract during the procedure and reduce the bloating. If you had a lower endoscopy (such as a colonoscopy or flexible sigmoidoscopy) you may notice spotting of blood in your stool or on the toilet paper. If you underwent a bowel prep for your procedure, you may not have a normal bowel movement for a few days.  Please Note:  You might notice some irritation and congestion in your nose or some drainage.  This is from the oxygen used during your procedure.  There is no need for concern and it should clear up in a day or so.  SYMPTOMS TO REPORT IMMEDIATELY:   Following lower endoscopy (colonoscopy or flexible sigmoidoscopy):  Excessive amounts of blood in the stool  Significant tenderness or worsening of abdominal pains  Swelling of the abdomen that is new, acute  Fever of 100F or higher   Following upper endoscopy (EGD)  Vomiting of blood or coffee ground material  New chest pain or pain under the shoulder blades  Painful or persistently difficult swallowing  New shortness of breath  Fever of 100F or higher  Black, tarry-looking stools  For urgent or emergent issues, a gastroenterologist can be reached at any hour by calling [phone].   DIET:  Please follow the dilatation diet the rest of today.  Handout was given to you with your  discharge information.   Drink plenty of fluids but you should avoid alcoholic beverages for 24 hours.  ACTIVITY:  You should plan to take it easy for the rest of today and you should NOT DRIVE or use heavy machinery until tomorrow (because of the sedation medicines used during the test).    FOLLOW UP: Our staff will call the number listed on your records 48-72 hours following your procedure to check on you and address any questions or concerns that you may have regarding the information given to you following your procedure. If we do not reach you, we will leave a message.  We will attempt to reach you two times.  During this call, we will ask if you have developed any symptoms of COVID 19. If you develop any symptoms (for example fever, flu-like symptoms, shortness of breath, cough etc.) before then, please call [phone].  If any biopsies were taken you will be contacted by phone or by letter within the next 1-3 weeks.  Please call us at [phone] if you have not heard about the biopsies in 3 weeks.    SIGNATURES/CONFIDENTIALITY: You and/or your care partner have signed paperwork which will be entered into your electronic medical record.  These signatures attest to the fact that that the information above on your After Visit Summary has been reviewed and is understood.  Full responsibility of the confidentiality of this discharge information lies with you and/or your care-partner.  Please call if any questions or concerns.Handouts were given to your care partner on polyps and the dilatation diet. You may resume your current medications today. Await biopsy results. Please call if any questions or concerns.

## 2018-05-24 NOTE — Progress Notes (Signed)
Report to PACU, RN, vss, BBS= Clear.  

## 2018-05-24 NOTE — Op Note (Signed)
[facility] Patient Name: Caleb Jones Procedure Date: 05/24/2018 7:16 AM MRN: [id number] Endoscopist: Docia Chuck. Henrene Jones , MD Age: 68 Referring MD:  Date of Birth: [birth year] Gender: Male Account #: [id number] Procedure:                Upper GI endoscopy with [facility] dilation of the                            esophagus. 15 [place] Indications:              Dysphagia, Therapeutic procedure Medicines:                Monitored Anesthesia Care Procedure:                Pre-Anesthesia Assessment:                           - Prior to the procedure, a History and Physical                            was performed, and patient medications and                            allergies were reviewed. The patient's tolerance of                            previous anesthesia was also reviewed. The risks                            and benefits of the procedure and the sedation                            options and risks were discussed with the patient.                            All questions were answered, and informed consent                            was obtained. Prior Anticoagulants: The patient has                            taken no previous anticoagulant or antiplatelet                            agents. ASA Grade Assessment: II - A patient with                            mild systemic disease. After reviewing the risks                            and benefits, the patient was deemed in                            satisfactory condition to undergo the procedure.  After obtaining informed consent, the endoscope was                            passed under direct vision. Throughout the                            procedure, the patient's blood pressure, pulse, and                            oxygen saturations were monitored continuously. The                            Endoscope was introduced through the mouth, and                            advanced to the second  part of duodenum. The upper                            GI endoscopy was accomplished without difficulty.                            The patient tolerated the procedure well. Scope In: Scope Out: Findings:                 One benign-appearing, intrinsic moderate stenosis                            was found 40 cm from the incisors. This stenosis                            measured 1.5 cm (inner diameter). The scope was                            withdrawn. Dilation was performed with a Maloney                            dilator with no resistance at 15 Fr.                           The exam of the esophagus was otherwise normal.                           The stomach was normal.                           The examined duodenum was normal.                           The cardia and gastric fundus were normal on                            retroflexion. Complications:            No immediate complications. Estimated Blood Loss:     Estimated blood loss: none. Impression:               -  Benign-appearing esophageal stenosis. Dilated.                           - Normal stomach.                           - Normal examined duodenum.                           - No specimens collected. Recommendation:           - Patient has a contact number available for                            emergencies. The signs and symptoms of potential                            delayed complications were discussed with the                            patient. Return to normal activities tomorrow.                            Written discharge instructions were provided to the                            patient.                           - Post dilation diet.                           - Continue present medications.                           - Routine GI follow-up 1 year Caleb N. Henrene Pastor, MD 05/24/2018 8:53:08 AM This report has been signed electronically.

## 2018-05-24 NOTE — Op Note (Signed)
[facility] Patient Name: Caleb Jones Procedure Date: 05/24/2018 7:16 AM MRN: [id number] Endoscopist: Docia Chuck. Henrene Pastor , MD Age: 68 Referring MD:  Date of Birth: 05/19/1950 Gender: Male Account #: [id number] Procedure:                Colonoscopy with cold snare polypectomy x2 Indications:              Screening for colorectal malignant neoplasm.                            Negative prior exam 2010 Medicines:                Monitored Anesthesia Care Procedure:                Pre-Anesthesia Assessment:                           - Prior to the procedure, a History and Physical                            was performed, and patient medications and                            allergies were reviewed. The patient's tolerance of                            previous anesthesia was also reviewed. The risks                            and benefits of the procedure and the sedation                            options and risks were discussed with the patient.                            All questions were answered, and informed consent                            was obtained. Prior Anticoagulants: The patient has                            taken no previous anticoagulant or antiplatelet                            agents. ASA Grade Assessment: II - A patient with                            mild systemic disease. After reviewing the risks                            and benefits, the patient was deemed in                            satisfactory condition to undergo the procedure.  After obtaining informed consent, the colonoscope                            was passed under direct vision. Throughout the                            procedure, the patient's blood pressure, pulse, and                            oxygen saturations were monitored continuously. The                            Colonoscope was introduced through the anus and                            advanced to  the the cecum, identified by                            appendiceal orifice and ileocecal valve. The                            Colonoscope was introduced through the anus and                            advanced to the the cecum, identified by                            appendiceal orifice and ileocecal valve. The                            ileocecal valve, appendiceal orifice, and rectum                            were photographed. The quality of the bowel                            preparation was excellent. The colonoscopy was                            performed without difficulty. The patient tolerated                            the procedure well. The bowel preparation used was                            SUPREP via split dose instruction. Scope In: 8:12:21 AM Scope Out: 8:25:51 AM Scope Withdrawal Time: 0 hours 11 minutes 54 seconds  Total Procedure Duration: 0 hours 13 minutes 30 seconds  Findings:                 Two polyps were found in the transverse colon and                            ascending colon. The polyps were 2 to 5 mm  in size.                            These polyps were removed with a cold snare.                            Resection and retrieval were complete.                           The exam was otherwise without abnormality on                            direct and retroflexion views. Complications:            No immediate complications. Estimated blood loss:                            None. Estimated Blood Loss:     Estimated blood loss: none. Impression:               - Two 2 to 5 mm polyps in the transverse colon and                            in the ascending colon, removed with a cold snare.                            Resected and retrieved.                           - The examination was otherwise normal on direct                            and retroflexion views. Recommendation:           - Repeat colonoscopy in 7-10 years for surveillance.                            - Patient has a contact number available for                            emergencies. The signs and symptoms of potential                            delayed complications were discussed with the                            patient. Return to normal activities tomorrow.                            Written discharge instructions were provided to the                            patient.                           - Resume previous diet.                           -  Continue present medications.                           - Await pathology results. Docia Chuck. Henrene Pastor, MD 05/24/2018 8:50:24 AM This report has been signed electronically.

## 2018-05-24 NOTE — Progress Notes (Signed)
Called to room to assist during endoscopic procedure.  Patient ID and intended procedure confirmed with present staff. Received instructions for my participation in the procedure from the performing physician.  

## 2018-05-24 NOTE — Progress Notes (Addendum)
No problems noted in the recovery room. Maw  Pt's mask was on his face on discharge. maw

## 2018-05-26 ENCOUNTER — Encounter: Payer: Self-pay | Admitting: Internal Medicine

## 2018-05-26 ENCOUNTER — Telehealth: Payer: Self-pay | Admitting: *Deleted

## 2018-05-26 ENCOUNTER — Telehealth: Payer: Self-pay

## 2018-05-26 NOTE — Telephone Encounter (Signed)
No answer for post follow up call left message and will call back. SM

## 2018-05-26 NOTE — Telephone Encounter (Signed)
Follow up call x 2, left a voicemail.

## 2018-06-10 ENCOUNTER — Other Ambulatory Visit: Payer: Self-pay | Admitting: Internal Medicine

## 2018-06-20 ENCOUNTER — Other Ambulatory Visit: Payer: Self-pay | Admitting: Internal Medicine

## 2018-06-21 NOTE — Telephone Encounter (Signed)
Check [place] registry last filled 05/09/2018.Marland KitchenJohny Jones

## 2018-06-27 NOTE — Patient Instructions (Addendum)
Tests ordered today. Your results will be released to Caleb Jones (or called to you) after review, usually within 72hours after test completion. If any changes need to be made, you will be notified at that same time.  All other Health Maintenance issues reviewed.   All recommended immunizations and age-appropriate screenings are up-to-date or discussed.  No immunizations administered today.   Medications reviewed and updated.  Changes include :   none     Please followup in 6 months    Health Maintenance, Male A healthy lifestyle and preventive care is important for your health and wellness. Ask your health care provider about what schedule of regular examinations is right for you. What should I know about weight and diet? Eat a Healthy Diet  Eat plenty of vegetables, fruits, whole grains, low-fat dairy products, and lean protein.  Do not eat a lot of foods high in solid fats, added sugars, or salt.  Maintain a Healthy Weight Regular exercise can help you achieve or maintain a healthy weight. You should:  Do at least 150 minutes of exercise each week. The exercise should increase your heart rate and make you sweat (moderate-intensity exercise).  Do strength-training exercises at least twice a week. Watch Your Levels of Cholesterol and Blood Lipids  Have your blood tested for lipids and cholesterol every 5 years starting at 68 years of age. If you are at high risk for heart disease, you should start having your blood tested when you are 68 years old. You may need to have your cholesterol levels checked more often if: ? Your lipid or cholesterol levels are high. ? You are older than 68 years of age. ? You are at high risk for heart disease. What should I know about cancer screening? Many types of cancers can be detected early and may often be prevented. Lung Cancer  You should be screened every year for lung cancer if: ? You are a current smoker who has smoked for at least 30  years. ? You are a former smoker who has quit within the past 15 years.  Talk to your health care provider about your screening options, when you should start screening, and how often you should be screened. Colorectal Cancer  Routine colorectal cancer screening usually begins at 68 years of age and should be repeated every 5-10 years until you are 68 years old. You may need to be screened more often if early forms of precancerous polyps or small growths are found. Your health care provider may recommend screening at an earlier age if you have risk factors for colon cancer.  Your health care provider may recommend using home test kits to check for hidden blood in the stool.  A small camera at the end of a tube can be used to examine your colon (sigmoidoscopy or colonoscopy). This checks for the earliest forms of colorectal cancer. Prostate and Testicular Cancer  Depending on your age and overall health, your health care provider may do certain tests to screen for prostate and testicular cancer.  Talk to your health care provider about any symptoms or concerns you have about testicular or prostate cancer. Skin Cancer  Check your skin from head to toe regularly.  Tell your health care provider about any new moles or changes in moles, especially if: ? There is a change in a mole's size, shape, or color. ? You have a mole that is larger than a pencil eraser.  Always use sunscreen. Apply sunscreen liberally and repeat throughout  the day.  Protect yourself by wearing long sleeves, pants, a wide-brimmed hat, and sunglasses when outside. What should I know about heart disease, diabetes, and high blood pressure?  If you are 61-32 years of age, have your blood pressure checked every 3-5 years. If you are 82 years of age or older, have your blood pressure checked every year. You should have your blood pressure measured twice-once when you are at a hospital or clinic, and once when you are not at a  hospital or clinic. Record the average of the two measurements. To check your blood pressure when you are not at a hospital or clinic, you can use: ? An automated blood pressure machine at a pharmacy. ? A home blood pressure monitor.  Talk to your health care provider about your target blood pressure.  If you are between 69-79 years old, ask your health care provider if you should take aspirin to prevent heart disease.  Have regular diabetes screenings by checking your fasting blood sugar level. ? If you are at a normal weight and have a low risk for diabetes, have this test once every three years after the age of 22. ? If you are overweight and have a high risk for diabetes, consider being tested at a younger age or more often.  A one-time screening for abdominal aortic aneurysm (AAA) by ultrasound is recommended for men aged 61-75 years who are current or former smokers. What should I know about preventing infection? Hepatitis B If you have a higher risk for hepatitis B, you should be screened for this virus. Talk with your health care provider to find out if you are at risk for hepatitis B infection. Hepatitis C Blood testing is recommended for:  Everyone born from [birth year] through 1965.  Anyone with known risk factors for hepatitis C. Sexually Transmitted Diseases (STDs)  You should be screened each year for STDs including gonorrhea and chlamydia if: ? You are sexually active and are younger than 68 years of age. ? You are older than 68 years of age and your health care provider tells you that you are at risk for this type of infection. ? Your sexual activity has changed since you were last screened and you are at an increased risk for chlamydia or gonorrhea. Ask your health care provider if you are at risk.  Talk with your health care provider about whether you are at high risk of being infected with HIV. Your health care provider may recommend a prescription medicine to help prevent  HIV infection. What else can I do?  Schedule regular health, dental, and eye exams.  Stay current with your vaccines (immunizations).  Do not use any tobacco products, such as cigarettes, chewing tobacco, and e-cigarettes. If you need help quitting, ask your health care provider.  Limit alcohol intake to no more than 2 drinks per day. One drink equals 12 ounces of beer, 5 ounces of wine, or 1 ounces of hard liquor.  Do not use street drugs.  Do not share needles.  Ask your health care provider for help if you need support or information about quitting drugs.  Tell your health care provider if you often feel depressed.  Tell your health care provider if you have ever been abused or do not feel safe at home. This information is not intended to replace advice given to you by your health care provider. Make sure you discuss any questions you have with your health care provider. Document Released: 06/21/2007 Document  Revised: 08/22/2015 Document Reviewed: 09/26/2014 Elsevier Interactive Patient Education  [employer].

## 2018-06-27 NOTE — Progress Notes (Signed)
Subjective:    Patient ID: Caleb Jones, male    DOB: [birth year], 68 y.o.   MRN: [id number]  HPI He is here for a physical exam.   He denies changes in his health and has no concerns.    Medications and allergies reviewed with patient and updated if appropriate.  Patient Active Problem List   Diagnosis Date Noted  . Reactive depression 05/14/2016  . Hashimoto's thyroiditis 09/01/2015  . 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 Palpitations 06/20/2015  . Food impaction of esophagus 04/25/2014  . Esophageal stricture 04/25/2014  . Sleep disorder 12/21/2013  . Anxiety 01/22/2009  . PROSTATE CANCER, HX OF 01/11/2009    Current Outpatient Medications on File Prior to Visit  Medication Sig Dispense Refill  . acetaminophen (TYLENOL) 500 MG tablet Take 500 mg by mouth at bedtime as needed.    . clonazePAM (KLONOPIN) 0.5 MG tablet TAKE 1 TABLET BY MOUTH AT BEDTIME. CAN TAKE ONE TABLET DURING THE DAY IF NEEDED 60 tablet 0  . Fish Oil-Cholecalciferol (OMEGA-3 + VITAMIN D3 PO) Take by mouth daily.    [name] levothyroxine (SYNTHROID) 25 MCG tablet TAKE 1 TABLET EVERY DAY BEFORE BREAKFAST 90 tablet 1  . Multiple Vitamins-Minerals (MULTIVITAMIN ADULT PO) Take by mouth 1 day or 1 dose.    . Na Sulfate-K Sulfate-Mg Sulf 17.5-3.13-1.6 GM/177ML SOLN Suprep-Use as directed 354 mL 0  . omeprazole (PRILOSEC) 20 MG capsule TAKE 1 CAPSULE BY MOUTH EVERY DAY 30 capsule 11  . sertraline (ZOLOFT) 100 MG tablet TAKE 1 TABLET BY MOUTH EVERY DAY 90 tablet 1   No current facility-administered medications on file prior to visit.     Past Medical History:  Diagnosis Date  . Anxiety   . Cancer ([place])   . Depression   . Esophageal fissure   . Esophageal stricture   . GERD (gastroesophageal reflux disease)   . History of prostate cancer 11/20/2008   Dr Alinda Money  . Internal hemorrhoids   . Pneumonia 01/2009   pleural effusion tapped  . Thyroid disease     Past Surgical History:  Procedure Laterality Date  .  COLONOSCOPY  04/2008   Dr Sharlett Iles  . ESOPHAGOGASTRODUODENOSCOPY N/A 04/25/2014   Procedure: ESOPHAGOGASTRODUODENOSCOPY (EGD);  Surgeon: Irene Shipper, MD;  Location: [facility];  Service: Endoscopy;  Laterality: N/A;  . PROSTATECTOMY  11/20/2008   Dr Dutch Gray  . REFRACTIVE SURGERY  2007   for Retina Dethachment, Dr Jalene Mullet , [facility]  . TONSILLECTOMY      Social History   Socioeconomic History  . Marital status: Single    Spouse name: Not on file  . Number of children: 0  . Years of education: Not on file  . Highest education level: Not on file  Occupational History  . Occupation: Museum/gallery exhibitions officer  . Financial resource strain: Not on file  . Food insecurity    Worry: Not on file    Inability: Not on file  . Transportation needs    Medical: Not on file    Non-medical: Not on file  Tobacco Use  . Smoking status: Never Smoker  . Smokeless tobacco: Never Used  Substance and Sexual Activity  . Alcohol use: Yes    Alcohol/week: 1.0 standard drinks    Types: 1 Cans of beer per week    Comment: Socially  . Drug use: No  . Sexual activity: Not on file  Lifestyle  . Physical activity    Days per week: Not on file  Minutes per session: Not on file  . Stress: Not on file  Relationships  . Social Herbalist on phone: Not on file    Gets together: Not on file    Attends religious service: Not on file    Active member of club or organization: Not on file    Attends meetings of clubs or organizations: Not on file    Relationship status: Not on file  Other Topics Concern  . Not on file  Social History Narrative   Exercise: walking regularly    Family History  Problem Relation Age of Onset  . Dementia Mother   . Hypertension Mother   . Transient ischemic attack Mother        > 89  . Cervical cancer Mother   . Prostate cancer Father 66        metastatic to bones, lungs & cns  . Breast cancer Sister        <50  . Diabetes Maternal Aunt    . Heart disease Neg Hx   . Colon cancer Neg Hx   . Stomach cancer Neg Hx   . Rectal cancer Neg Hx     Review of Systems  Constitutional: Negative for chills and fever.  Eyes: Negative for visual disturbance.  Respiratory: Negative for cough, shortness of breath and wheezing.   Cardiovascular: Positive for palpitations (with anxiety). Negative for chest pain.  Gastrointestinal: Negative for abdominal pain, blood in stool, constipation, diarrhea and nausea.  Genitourinary: Negative for dysuria and hematuria.  Musculoskeletal: Positive for back pain (mild). Negative for arthralgias.  Skin: Negative for color change and rash.  Neurological: Positive for light-headedness (with palpitations) and headaches (computer related). Negative for dizziness.  Psychiatric/Behavioral: Positive for dysphoric mood and sleep disturbance. The patient is nervous/anxious.        Objective:   Vitals:   06/29/18 0748  BP: (!) 146/90  Pulse: 63  Resp: 16  Temp: 98.7 F (37.1 C)  SpO2: 96%   Filed Weights   06/29/18 0748  Weight: 213 lb 12.8 oz (97 kg)   Body mass index is 26.72 kg/m.  BP Readings from Last 3 Encounters:  06/29/18 (!) 146/90  05/24/18 113/81  12/24/17 120/78    Wt Readings from Last 3 Encounters:  06/29/18 213 lb 12.8 oz (97 kg)  05/24/18 200 lb (90.7 kg)  05/10/18 200 lb (90.7 kg)     Physical Exam Constitutional: He appears well-developed and well-nourished. No distress.  HENT:  Head: Normocephalic and atraumatic.  Right Ear: External ear normal.  Left Ear: External ear normal.  Mouth/Throat: Oropharynx is clear and moist.  Normal ear canals and TM b/l  Eyes: Conjunctivae and EOM are normal.  Neck: Neck supple. No tracheal deviation present. No thyromegaly present.  No carotid bruit  Cardiovascular: Normal rate, regular rhythm, normal heart sounds and intact distal pulses.  No murmur heard. Pulmonary/Chest: Effort normal and breath sounds normal. No respiratory  distress. He has no wheezes. He has no rales.  Abdominal: Soft. He exhibits no distension. There is no tenderness.  Genitourinary: deferred to urology Musculoskeletal: He exhibits no edema.  Lymphadenopathy:   He has no cervical adenopathy.  Skin: Skin is warm and dry. He is not diaphoretic.  Psychiatric: He has a normal mood and affect. His behavior is normal.         Assessment & Plan:   Physical exam: Screening blood work  ordered Immunizations  Discussed shingrix, pneumovax deferred, others up  to date Colonoscopy  Up to date  Eye exams  Not up to date - will schedule Exercise  Very little exercise  - 2 /week - advised increasing exercise Weight   Good for age Skin    No concerns Substance abuse   none  See Problem List for Assessment and Plan of chronic medical problems.  FU in 6 months

## 2018-06-29 ENCOUNTER — Encounter: Payer: Self-pay | Admitting: Internal Medicine

## 2018-06-29 ENCOUNTER — Ambulatory Visit (INDEPENDENT_AMBULATORY_CARE_PROVIDER_SITE_OTHER): Payer: 59 | Admitting: Internal Medicine

## 2018-06-29 ENCOUNTER — Other Ambulatory Visit (INDEPENDENT_AMBULATORY_CARE_PROVIDER_SITE_OTHER): Payer: 59

## 2018-06-29 ENCOUNTER — Other Ambulatory Visit: Payer: Self-pay

## 2018-06-29 VITALS — BP 146/90 | HR 63 | Temp 98.7°F | Resp 16 | Ht 75.0 in | Wt 213.8 lb

## 2018-06-29 DIAGNOSIS — F419 Anxiety disorder, unspecified: Secondary | ICD-10-CM | POA: Diagnosis not present

## 2018-06-29 DIAGNOSIS — E063 Autoimmune thyroiditis: Secondary | ICD-10-CM | POA: Diagnosis not present

## 2018-06-29 DIAGNOSIS — Z8546 Personal history of malignant neoplasm of prostate: Secondary | ICD-10-CM

## 2018-06-29 DIAGNOSIS — G479 Sleep disorder, unspecified: Secondary | ICD-10-CM

## 2018-06-29 DIAGNOSIS — F329 Major depressive disorder, single episode, unspecified: Secondary | ICD-10-CM

## 2018-06-29 DIAGNOSIS — Z0000 Encounter for general adult medical examination without abnormal findings: Secondary | ICD-10-CM

## 2018-06-29 DIAGNOSIS — K222 Esophageal obstruction: Secondary | ICD-10-CM

## 2018-06-29 LAB — COMPREHENSIVE METABOLIC PANEL
ALT: 13 U/L (ref 0–53)
AST: 18 U/L (ref 0–37)
Albumin: 4.4 g/dL (ref 3.5–5.2)
Alkaline Phosphatase: 61 U/L (ref 39–117)
BUN: 13 mg/dL (ref 6–23)
CO2: 26 mEq/L (ref 19–32)
Calcium: 9.1 mg/dL (ref 8.4–10.5)
Chloride: 99 mEq/L (ref 96–112)
Creatinine, Ser: 1.27 mg/dL (ref 0.40–1.50)
GFR: 56.38 mL/min — ABNORMAL LOW (ref >60.00)
Glucose, Bld: 89 mg/dL (ref 70–99)
Potassium: 4.2 mEq/L (ref 3.5–5.1)
Sodium: 135 mEq/L (ref 135–145)
Total Bilirubin: 0.6 mg/dL (ref 0.2–1.2)
Total Protein: 7.2 g/dL (ref 6.0–8.3)

## 2018-06-29 LAB — CBC WITH DIFFERENTIAL/PLATELET
Basophils Absolute: 0.1 10*3/uL (ref 0.0–0.1)
Basophils Relative: 1.2 % (ref 0.0–3.0)
Eosinophils Absolute: 0.1 10*3/uL (ref 0.0–0.7)
Eosinophils Relative: 1.9 % (ref 0.0–5.0)
HCT: 46.6 % (ref 39.0–52.0)
Hemoglobin: 15.6 g/dL (ref 13.0–17.0)
Lymphocytes Relative: 23.2 % (ref 12.0–46.0)
Lymphs Abs: 1.7 10*3/uL (ref 0.7–4.0)
MCHC: 33.5 g/dL (ref 30.0–36.0)
MCV: 93 fl (ref 78.0–100.0)
Monocytes Absolute: 1 10*3/uL (ref 0.1–1.0)
Monocytes Relative: 14 % — ABNORMAL HIGH (ref 3.0–12.0)
Neutro Abs: 4.4 10*3/uL (ref 1.4–7.7)
Neutrophils Relative %: 59.7 % (ref 43.0–77.0)
Platelets: 219 10*3/uL (ref 150.0–400.0)
RBC: 5.01 Mil/uL (ref 4.22–5.81)
RDW: 13.2 % (ref 11.5–15.5)
WBC: 7.4 10*3/uL (ref 4.0–10.5)

## 2018-06-29 LAB — TSH: TSH: 4.42 u[IU]/mL (ref 0.35–4.50)

## 2018-06-29 LAB — LIPID PANEL
Cholesterol: 196 mg/dL (ref 0–200)
HDL: 51.7 mg/dL (ref >39.00)
LDL Cholesterol: 124 mg/dL — ABNORMAL HIGH (ref 0–99)
NonHDL: 144.64
Total CHOL/HDL Ratio: 4
Triglycerides: 105 mg/dL (ref 0.0–149.0)
VLDL: 21 mg/dL (ref 0.0–40.0)

## 2018-06-29 MED ORDER — CLONAZEPAM 0.5 MG PO TABS: 0.2500 mg | ORAL_TABLET | Freq: Two times a day (BID) | ORAL | 0 refills | Status: DC | PRN

## 2018-06-29 NOTE — Assessment & Plan Note (Signed)
Fairly controlled Taking sertraline 100 mg daily Taking clonazepam twice daily - 1/2 in am and 1 pill at bedtime Overall controlled Discussed ideally keeping clonazepam to a minimum Increase exercise

## 2018-06-29 NOTE — Assessment & Plan Note (Signed)
Clinically euthyroid Check tsh  Titrate med dose if needed  

## 2018-06-29 NOTE — Assessment & Plan Note (Signed)
Controlled, stable Continue current dose of medication - sertraline 100 mg daily

## 2018-06-29 NOTE — Assessment & Plan Note (Signed)
Follows with urology

## 2018-06-29 NOTE — Assessment & Plan Note (Signed)
Taking omeprazole daily.  No gerd - h/o stricture Continue omeprazole

## 2018-06-29 NOTE — Assessment & Plan Note (Signed)
Taking clonazepam nightly Controlled, stable Continue current dose of medication

## 2018-06-30 ENCOUNTER — Encounter: Payer: Self-pay | Admitting: Internal Medicine

## 2018-07-29 ENCOUNTER — Other Ambulatory Visit: Payer: Self-pay | Admitting: Internal Medicine

## 2018-07-29 NOTE — Telephone Encounter (Signed)
Last refill was 06/21/18 Last OV 06/29/18 Next OV 12/29/18

## 2018-09-09 ENCOUNTER — Other Ambulatory Visit: Payer: Self-pay | Admitting: Internal Medicine

## 2018-09-21 ENCOUNTER — Other Ambulatory Visit: Payer: Self-pay | Admitting: Internal Medicine

## 2018-10-21 ENCOUNTER — Other Ambulatory Visit: Payer: Self-pay | Admitting: Internal Medicine

## 2018-12-06 ENCOUNTER — Other Ambulatory Visit: Payer: Self-pay | Admitting: Internal Medicine

## 2018-12-06 NOTE — Telephone Encounter (Signed)
[place] Controlled Database Checked Last filled: 10/21/18 # 60 LOV w/you: 06/29/18 Next appt w/you: 12/29/18

## 2018-12-26 ENCOUNTER — Other Ambulatory Visit: Payer: Self-pay | Admitting: Internal Medicine

## 2018-12-28 NOTE — Progress Notes (Signed)
Subjective:    Patient ID: Caleb Jones, male    DOB: 03-20-1950, 68 y.o.   MRN: [id number]  HPI The patient is here for follow up.  He is not exercising regularly.    Hypothyroidism:  He is taking his medication daily.  He denies any recent changes in energy or weight that are unexplained.   Anxiety, sleep difficulty: He is taking his sertraline daily and clonazepam daily. He denies any side effects from the medication. He feels his anxiety is well controlled and he is happy with his current dose of medication.  He is sleeping well.    Depression: He is taking his sertraline daily as prescribed. He denies any side effects from the medication. He feels his depression is well controlled and he is happy with his current dose of medication.     Medications and allergies reviewed with patient and updated if appropriate.  Patient Active Problem List   Diagnosis Date Noted  . Reactive depression 05/14/2016  . Hashimoto's thyroiditis 09/01/2015  . 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 Palpitations 06/20/2015  . Food impaction of esophagus 04/25/2014  . Esophageal stricture 04/25/2014  . Sleep disorder 12/21/2013  . Anxiety 01/22/2009  . PROSTATE CANCER, HX OF 01/11/2009    Current Outpatient Medications on File Prior to Visit  Medication Sig Dispense Refill  . acetaminophen (TYLENOL) 500 MG tablet Take 500 mg by mouth at bedtime as needed.    . clonazePAM (KLONOPIN) 0.5 MG tablet TAKE 1 TABLET BY MOUTH EVERY DAY AT BEDTIME *CAN TAKE 1 ADDITIONAL TAB DURING THE DAY IF NEEDED* 60 tablet 0  . Fish Oil-Cholecalciferol (OMEGA-3 + VITAMIN D3 PO) Take by mouth daily.    [name] levothyroxine (SYNTHROID) 25 MCG tablet TAKE 1 TABLET EVERY DAY BEFORE BREAKFAST 90 tablet 1  . Multiple Vitamins-Minerals (MULTIVITAMIN ADULT PO) Take by mouth 1 day or 1 dose.    . Na Sulfate-K Sulfate-Mg Sulf 17.5-3.13-1.6 GM/177ML SOLN Suprep-Use as directed 354 mL 0  . omeprazole (PRILOSEC) 20 MG capsule TAKE 1 CAPSULE BY  MOUTH EVERY DAY 30 capsule 11  . sertraline (ZOLOFT) 100 MG tablet TAKE 1 TABLET BY MOUTH EVERY DAY 90 tablet 1   No current facility-administered medications on file prior to visit.    Past Medical History:  Diagnosis Date  . Anxiety   . Cancer ([place])   . Depression   . Esophageal fissure   . Esophageal stricture   . GERD (gastroesophageal reflux disease)   . History of prostate cancer 11/20/2008   Dr Alinda Money  . Internal hemorrhoids   . Pneumonia 01/2009   pleural effusion tapped  . Thyroid disease     Past Surgical History:  Procedure Laterality Date  . COLONOSCOPY  04/2008   Dr Sharlett Iles  . ESOPHAGOGASTRODUODENOSCOPY N/A 04/25/2014   Procedure: ESOPHAGOGASTRODUODENOSCOPY (EGD);  Surgeon: Irene Shipper, MD;  Location: [facility];  Service: Endoscopy;  Laterality: N/A;  . PROSTATECTOMY  11/20/2008   Dr Dutch Gray  . REFRACTIVE SURGERY  2007   for Retina Dethachment, Dr Jalene Mullet , [facility]  . TONSILLECTOMY      Social History   Socioeconomic History  . Marital status: Single    Spouse name: Not on file  . Number of children: 0  . Years of education: Not on file  . Highest education level: Not on file  Occupational History  . Occupation: Lobbyist  Tobacco Use  . Smoking status: Never Smoker  . Smokeless tobacco: Never Used  Substance and Sexual  Activity  . Alcohol use: Yes    Alcohol/week: 1.0 standard drinks    Types: 1 Cans of beer per week    Comment: Socially  . Drug use: No  . Sexual activity: Not on file  Other Topics Concern  . Not on file  Social History Narrative   Exercise: walking regularly   Social Determinants of Health   Financial Resource Strain:   . Difficulty of Paying Living Expenses: Not on file  Food Insecurity:   . Worried About Charity fundraiser in the Last Year: Not on file  . Ran Out of Food in the Last Year: Not on file  Transportation Needs:   . Lack of Transportation (Medical): Not on file  . Lack of Transportation  (Non-Medical): Not on file  Physical Activity:   . Days of Exercise per Week: Not on file  . Minutes of Exercise per Session: Not on file  Stress:   . Feeling of Stress : Not on file  Social Connections:   . Frequency of Communication with Friends and Family: Not on file  . Frequency of Social Gatherings with Friends and Family: Not on file  . Attends Religious Services: Not on file  . Active Member of Clubs or Organizations: Not on file  . Attends Archivist Meetings: Not on file  . Marital Status: Not on file    Family History  Problem Relation Age of Onset  . Dementia Mother   . Hypertension Mother   . Transient ischemic attack Mother        > 71  . Cervical cancer Mother   . Prostate cancer Father 84        metastatic to bones, lungs & cns  . Breast cancer Sister        <50  . Diabetes Maternal Aunt   . Heart disease Neg Hx   . Colon cancer Neg Hx   . Stomach cancer Neg Hx   . Rectal cancer Neg Hx     Review of Systems  Constitutional: Negative for chills, fatigue and fever.  Respiratory: Negative for cough, shortness of breath and wheezing.   Cardiovascular: Positive for palpitations (with anxiety). Negative for chest pain and leg swelling.  Neurological: Negative for light-headedness and headaches.  Psychiatric/Behavioral: Positive for dysphoric mood. Negative for sleep disturbance. The patient is nervous/anxious.        Objective:   Vitals:   12/29/18 0749  BP: 134/82  Pulse: 74  Resp: 16  Temp: 97.6 F (36.4 C)  SpO2: 96%   BP Readings from Last 3 Encounters:  12/29/18 134/82  06/29/18 (!) 146/90  05/24/18 113/81   Wt Readings from Last 3 Encounters:  12/29/18 214 lb (97.1 kg)  06/29/18 213 lb 12.8 oz (97 kg)  05/24/18 200 lb (90.7 kg)   Body mass index is 26.75 kg/m.   Physical Exam    Constitutional: Appears well-developed and well-nourished. No distress.  HENT:  Head: Normocephalic and atraumatic.  Neck: Neck supple. No  tracheal deviation present. No thyromegaly present.  No cervical lymphadenopathy Cardiovascular: Normal rate, regular rhythm and normal heart sounds.   No murmur heard. No carotid bruit .  No edema Pulmonary/Chest: Effort normal and breath sounds normal. No respiratory distress. No has no wheezes. No rales.  Skin: Skin is warm and dry. Not diaphoretic.  Psychiatric: Normal mood and affect. Behavior is normal.      Assessment & Plan:    See Problem List for Assessment and  Plan of chronic medical problems.     This visit occurred during the SARS-CoV-2 public health emergency.  Safety protocols were in place, including screening questions prior to the visit, additional usage of staff PPE, and extensive cleaning of exam room while observing appropriate contact time as indicated for disinfecting solutions.

## 2018-12-28 NOTE — Patient Instructions (Addendum)
  Tests ordered today. Your results will be released to MyChart (or called to you) after review.  If any changes need to be made, you will be notified at that same time.    Medications reviewed and updated.  Changes include :   none     Please followup in 6 months   

## 2018-12-29 ENCOUNTER — Other Ambulatory Visit: Payer: Self-pay

## 2018-12-29 ENCOUNTER — Encounter: Payer: Self-pay | Admitting: Internal Medicine

## 2018-12-29 ENCOUNTER — Ambulatory Visit (INDEPENDENT_AMBULATORY_CARE_PROVIDER_SITE_OTHER): Payer: 59 | Admitting: Internal Medicine

## 2018-12-29 VITALS — BP 134/82 | HR 74 | Temp 97.6°F | Resp 16 | Ht 75.0 in | Wt 214.0 lb

## 2018-12-29 DIAGNOSIS — F329 Major depressive disorder, single episode, unspecified: Secondary | ICD-10-CM

## 2018-12-29 DIAGNOSIS — K222 Esophageal obstruction: Secondary | ICD-10-CM

## 2018-12-29 DIAGNOSIS — F419 Anxiety disorder, unspecified: Secondary | ICD-10-CM

## 2018-12-29 DIAGNOSIS — G479 Sleep disorder, unspecified: Secondary | ICD-10-CM

## 2018-12-29 DIAGNOSIS — E063 Autoimmune thyroiditis: Secondary | ICD-10-CM | POA: Diagnosis not present

## 2018-12-29 LAB — BASIC METABOLIC PANEL
BUN: 12 mg/dL (ref 6–23)
CO2: 27 mEq/L (ref 19–32)
Calcium: 9.3 mg/dL (ref 8.4–10.5)
Chloride: 102 mEq/L (ref 96–112)
Creatinine, Ser: 1.31 mg/dL (ref 0.40–1.50)
GFR: 54.32 mL/min — ABNORMAL LOW (ref >60.00)
Glucose, Bld: 81 mg/dL (ref 70–99)
Potassium: 4.4 mEq/L (ref 3.5–5.1)
Sodium: 137 mEq/L (ref 135–145)

## 2018-12-29 LAB — HEPATIC FUNCTION PANEL
ALT: 14 U/L (ref 0–53)
AST: 16 U/L (ref 0–37)
Albumin: 4.2 g/dL (ref 3.5–5.2)
Alkaline Phosphatase: 70 U/L (ref 39–117)
Bilirubin, Direct: 0.1 mg/dL (ref 0.0–0.3)
Total Bilirubin: 0.3 mg/dL (ref 0.2–1.2)
Total Protein: 6.8 g/dL (ref 6.0–8.3)

## 2018-12-29 LAB — TSH: TSH: 4.51 u[IU]/mL — ABNORMAL HIGH (ref 0.35–4.50)

## 2018-12-29 NOTE — Assessment & Plan Note (Signed)
Clinically euthyroid Check tsh  Titrate med dose if needed  

## 2018-12-29 NOTE — Assessment & Plan Note (Signed)
Overall sleep is been controlled Continue current medications

## 2018-12-29 NOTE — Assessment & Plan Note (Signed)
Depression controlled Continue sertraline 100 mg daily

## 2018-12-29 NOTE — Assessment & Plan Note (Signed)
No GERD, difficulty swallowing Continue omeprazole Following with GI

## 2018-12-29 NOTE — Assessment & Plan Note (Signed)
At this time anxiety is overall controlled Continue clonazepam-takes half pill every morning and occasionally takes another half during the day if needed Continue sertraline at current dose

## 2019-01-02 ENCOUNTER — Other Ambulatory Visit: Payer: Self-pay | Admitting: Internal Medicine

## 2019-01-02 ENCOUNTER — Encounter: Payer: Self-pay | Admitting: Internal Medicine

## 2019-01-02 MED ORDER — LEVOTHYROXINE SODIUM 50 MCG PO TABS: 50.0000 ug | ORAL_TABLET | Freq: Every day | ORAL | 3 refills | Status: DC

## 2019-01-16 ENCOUNTER — Other Ambulatory Visit: Payer: Self-pay | Admitting: Internal Medicine

## 2019-01-17 NOTE — Telephone Encounter (Signed)
[place] Controlled Database Checked Last filled: 12/06/18 # 60 LOV w/you: 12/29/18 Next appt w/you: None

## 2019-03-02 ENCOUNTER — Other Ambulatory Visit: Payer: Self-pay | Admitting: Internal Medicine

## 2019-03-02 NOTE — Telephone Encounter (Signed)
Checked controlled substance database, last filled 01/17/19.

## 2019-03-04 ENCOUNTER — Encounter: Payer: Self-pay | Admitting: Internal Medicine

## 2019-03-05 MED ORDER — LEVOTHYROXINE SODIUM 50 MCG PO TABS: 50.0000 ug | ORAL_TABLET | Freq: Every day | ORAL | 3 refills | Status: DC

## 2019-03-16 ENCOUNTER — Other Ambulatory Visit: Payer: Self-pay

## 2019-03-16 MED ORDER — SERTRALINE HCL 100 MG PO TABS: 100.0000 mg | ORAL_TABLET | Freq: Every day | ORAL | 0 refills | Status: DC

## 2019-04-07 ENCOUNTER — Other Ambulatory Visit: Payer: Self-pay | Admitting: Internal Medicine

## 2019-04-07 NOTE — Telephone Encounter (Signed)
Last OV 12/29/18 Next OV NA Last RF 03/03/19

## 2019-05-19 ENCOUNTER — Other Ambulatory Visit: Payer: Self-pay | Admitting: Internal Medicine

## 2019-05-20 NOTE — Telephone Encounter (Signed)
Last OV 12/29/19 Next OV NA Last RF 04/08/19

## 2019-06-28 ENCOUNTER — Other Ambulatory Visit: Payer: Self-pay | Admitting: Internal Medicine

## 2019-07-27 DIAGNOSIS — H18603 Keratoconus, unspecified, bilateral: Secondary | ICD-10-CM | POA: Diagnosis not present

## 2019-07-27 DIAGNOSIS — H15101 Unspecified episcleritis, right eye: Secondary | ICD-10-CM | POA: Diagnosis not present

## 2019-08-08 ENCOUNTER — Other Ambulatory Visit: Payer: Self-pay | Admitting: Internal Medicine

## 2019-08-15 ENCOUNTER — Other Ambulatory Visit: Payer: Self-pay | Admitting: Internal Medicine

## 2019-08-27 ENCOUNTER — Other Ambulatory Visit: Payer: Self-pay | Admitting: Internal Medicine

## 2019-09-06 DIAGNOSIS — H18603 Keratoconus, unspecified, bilateral: Secondary | ICD-10-CM | POA: Diagnosis not present

## 2019-09-06 DIAGNOSIS — H15101 Unspecified episcleritis, right eye: Secondary | ICD-10-CM | POA: Diagnosis not present

## 2019-09-16 ENCOUNTER — Other Ambulatory Visit: Payer: Self-pay | Admitting: Internal Medicine

## 2019-09-28 ENCOUNTER — Encounter: Payer: Self-pay | Admitting: Internal Medicine

## 2019-09-28 ENCOUNTER — Other Ambulatory Visit: Payer: Self-pay | Admitting: Internal Medicine

## 2019-09-28 ENCOUNTER — Telehealth: Payer: Self-pay | Admitting: Internal Medicine

## 2019-09-28 MED ORDER — OMEPRAZOLE 20 MG PO CPDR: DELAYED_RELEASE_CAPSULE | ORAL | 1 refills | Status: DC

## 2019-09-28 NOTE — Telephone Encounter (Signed)
Refilled Omeprazole 

## 2019-10-05 ENCOUNTER — Other Ambulatory Visit: Payer: Self-pay

## 2019-10-05 DIAGNOSIS — Z20822 Contact with and (suspected) exposure to covid-19: Secondary | ICD-10-CM

## 2019-10-06 LAB — NOVEL CORONAVIRUS, NAA: SARS-CoV-2, NAA: NOT DETECTED

## 2019-10-06 LAB — SARS-COV-2, NAA 2 DAY TAT

## 2019-10-19 ENCOUNTER — Other Ambulatory Visit: Payer: Self-pay

## 2019-10-19 DIAGNOSIS — Z20822 Contact with and (suspected) exposure to covid-19: Secondary | ICD-10-CM | POA: Diagnosis not present

## 2019-10-20 LAB — NOVEL CORONAVIRUS, NAA: SARS-CoV-2, NAA: NOT DETECTED

## 2019-10-20 LAB — SARS-COV-2, NAA 2 DAY TAT

## 2019-10-26 ENCOUNTER — Other Ambulatory Visit: Payer: Self-pay | Admitting: Internal Medicine

## 2019-10-28 ENCOUNTER — Ambulatory Visit: Payer: Self-pay | Attending: Internal Medicine

## 2019-10-28 DIAGNOSIS — Z23 Encounter for immunization: Secondary | ICD-10-CM

## 2019-10-28 NOTE — Progress Notes (Signed)
° °  Covid-19 Vaccination Clinic  Name:  Caleb Jones [facility]    MRN: [id number] DOB: 1950/12/21  10/28/2019  Mr. Caleb Jones was observed post Covid-19 immunization for 15 minutes without incident. He was provided with Vaccine Information Sheet and instruction to access the V-Safe system.   Mr. Caleb Jones was instructed to call 911 with any severe reactions post vaccine:  Difficulty breathing   Swelling of face and throat   A fast heartbeat   A bad rash all over body   Dizziness and weakness

## 2019-11-06 ENCOUNTER — Other Ambulatory Visit: Payer: Self-pay | Admitting: Internal Medicine

## 2019-11-17 ENCOUNTER — Ambulatory Visit: Payer: BC Managed Care – PPO | Admitting: Internal Medicine

## 2019-11-17 ENCOUNTER — Encounter: Payer: Self-pay | Admitting: Internal Medicine

## 2019-11-17 VITALS — BP 118/80 | HR 88 | Ht 72.75 in | Wt 218.1 lb

## 2019-11-17 DIAGNOSIS — K219 Gastro-esophageal reflux disease without esophagitis: Secondary | ICD-10-CM

## 2019-11-17 DIAGNOSIS — K222 Esophageal obstruction: Secondary | ICD-10-CM | POA: Diagnosis not present

## 2019-11-17 MED ORDER — OMEPRAZOLE 20 MG PO CPDR: DELAYED_RELEASE_CAPSULE | ORAL | 3 refills | Status: DC

## 2019-11-17 NOTE — Progress Notes (Signed)
HISTORY OF PRESENT ILLNESS:  Caleb Jones is a 69 y.o. male with GERD complicated by peptic stricture and food impaction requiring endoscopic attention. He presents today for follow-up. He was last seen 05/24/2018 when he underwent screening colonoscopy and upper endoscopy with esophageal dilation (54 [place]) for dysphagia. Post dilation he has been maintained on omeprazole 20 daily. He tells me that he has been doing well. No reflux symptoms. No recurrent dysphagia. No lower GI complaints. He does request medication refill. His interval health has been well. He has completed his Covid vaccination series and booster.  REVIEW OF SYSTEMS:  All non-GI ROS negative unless otherwise stated in the HPI except for anxiety, depression, allergies  Past Medical History:  Diagnosis Date  . Anxiety   . Cancer ([place])   . Depression   . Esophageal fissure   . Esophageal stricture   . GERD (gastroesophageal reflux disease)   . History of prostate cancer 11/20/2008   Dr Alinda Money  . Internal hemorrhoids   . Pneumonia 01/2009   pleural effusion tapped  . Thyroid disease     Past Surgical History:  Procedure Laterality Date  . COLONOSCOPY  04/2008   Dr Sharlett Iles  . ESOPHAGOGASTRODUODENOSCOPY N/A 04/25/2014   Procedure: ESOPHAGOGASTRODUODENOSCOPY (EGD);  Surgeon: Irene Shipper, MD;  Location: [facility];  Service: Endoscopy;  Laterality: N/A;  . PROSTATECTOMY  11/20/2008   Dr Dutch Gray  . REFRACTIVE SURGERY  2007   for Retina Dethachment, Dr Jalene Mullet , [facility]  . TONSILLECTOMY      Social History Caleb Jones  reports that he has never smoked. He has never used smokeless tobacco. He reports current alcohol use of about 1.0 standard drink of alcohol per week. He reports that he does not use drugs.  family history includes Breast cancer in his sister; Cervical cancer in his mother; Dementia in his mother; Diabetes in his maternal aunt; Hypertension in his mother; Prostate cancer (age of  onset: 3) in his father; Transient ischemic attack in his mother.  No Known Allergies     PHYSICAL EXAMINATION: Vital signs: BP 118/80 (BP Location: Left Arm, Patient Position: Sitting, Cuff Size: Normal)   Pulse 88   Ht 6' 0.75" (1.848 m) Comment: height measured without shoes  Wt 218 lb 2 oz (98.9 kg)   BMI 28.98 kg/m   Constitutional: generally well-appearing, no acute distress Psychiatric: alert and oriented x3, cooperative Eyes: extraocular movements intact, anicteric, conjunctiva pink Mouth: oral pharynx moist, no lesions Neck: supple no lymphadenopathy Cardiovascular: heart regular rate and rhythm, no murmur Lungs: clear to auscultation bilaterally Abdomen: soft, nontender, nondistended, no obvious ascites, no peritoneal signs, normal bowel sounds, no organomegaly Rectal: Omitted Extremities: no clubbing, cyanosis, or lower extremity edema bilaterally Skin: no lesions on visible extremities Neuro: No focal deficits. Cranial nerves intact  ASSESSMENT:  1. GERD complicated by peptic stricture. Asymptomatic post dilation on PPI 2. Colonoscopy May 2020 with tubular adenoma and SSP.   PLAN:  1. Reflux precautions 2. Refill omeprazole 20 mg daily. Medication risks reviewed 3. Surveillance colonoscopy around May 2027 4. Routine office follow-up 1 year. Contact the office in the interim for any questions or problems. He agrees.

## 2019-11-17 NOTE — Patient Instructions (Signed)
We have sent the following medications to your pharmacy for you to pick up at your convenience:  Omeprazole.  Please follow up in one year  

## 2019-11-29 NOTE — Patient Instructions (Addendum)
If your shortness of breath and balance get worse or persist please return.    Blood work was ordered.  A chest x-ray was ordered.     Flu immunization administered today.   Medications changes include :   none   Please followup in 6 months    Health Maintenance, Male Adopting a healthy lifestyle and getting preventive care are important in promoting health and wellness. Ask your health care provider about:  The right schedule for you to have regular tests and exams.  Things you can do on your own to prevent diseases and keep yourself healthy. What should I know about diet, weight, and exercise? Eat a healthy diet   Eat a diet that includes plenty of vegetables, fruits, low-fat dairy products, and lean protein.  Do not eat a lot of foods that are high in solid fats, added sugars, or sodium. Maintain a healthy weight Body mass index (BMI) is a measurement that can be used to identify possible weight problems. It estimates body fat based on height and weight. Your health care provider can help determine your BMI and help you achieve or maintain a healthy weight. Get regular exercise Get regular exercise. This is one of the most important things you can do for your health. Most adults should:  Exercise for at least 150 minutes each week. The exercise should increase your heart rate and make you sweat (moderate-intensity exercise).  Do strengthening exercises at least twice a week. This is in addition to the moderate-intensity exercise.  Spend less time sitting. Even light physical activity can be beneficial. Watch cholesterol and blood lipids Have your blood tested for lipids and cholesterol at 69 years of age, then have this test every 5 years. You may need to have your cholesterol levels checked more often if:  Your lipid or cholesterol levels are high.  You are older than 69 years of age.  You are at high risk for heart disease. What should I know about cancer  screening? Many types of cancers can be detected early and may often be prevented. Depending on your health history and family history, you may need to have cancer screening at various ages. This may include screening for:  Colorectal cancer.  Prostate cancer.  Skin cancer.  Lung cancer. What should I know about heart disease, diabetes, and high blood pressure? Blood pressure and heart disease  High blood pressure causes heart disease and increases the risk of stroke. This is more likely to develop in people who have high blood pressure readings, are of African descent, or are overweight.  Talk with your health care provider about your target blood pressure readings.  Have your blood pressure checked: ? Every 3-5 years if you are 9-43 years of age. ? Every year if you are 20 years old or older.  If you are between the ages of 42 and 75 and are a current or former smoker, ask your health care provider if you should have a one-time screening for abdominal aortic aneurysm (AAA). Diabetes Have regular diabetes screenings. This checks your fasting blood sugar level. Have the screening done:  Once every three years after age 47 if you are at a normal weight and have a low risk for diabetes.  More often and at a younger age if you are overweight or have a high risk for diabetes. What should I know about preventing infection? Hepatitis B If you have a higher risk for hepatitis B, you should be screened for this  virus. Talk with your health care provider to find out if you are at risk for hepatitis B infection. Hepatitis C Blood testing is recommended for:  Everyone born from [birth year] through 1965.  Anyone with known risk factors for hepatitis C. Sexually transmitted infections (STIs)  You should be screened each year for STIs, including gonorrhea and chlamydia, if: ? You are sexually active and are younger than 69 years of age. ? You are older than 69 years of age and your health care  provider tells you that you are at risk for this type of infection. ? Your sexual activity has changed since you were last screened, and you are at increased risk for chlamydia or gonorrhea. Ask your health care provider if you are at risk.  Ask your health care provider about whether you are at high risk for HIV. Your health care provider may recommend a prescription medicine to help prevent HIV infection. If you choose to take medicine to prevent HIV, you should first get tested for HIV. You should then be tested every 3 months for as long as you are taking the medicine. Follow these instructions at home: Lifestyle  Do not use any products that contain nicotine or tobacco, such as cigarettes, e-cigarettes, and chewing tobacco. If you need help quitting, ask your health care provider.  Do not use street drugs.  Do not share needles.  Ask your health care provider for help if you need support or information about quitting drugs. Alcohol use  Do not drink alcohol if your health care provider tells you not to drink.  If you drink alcohol: ? Limit how much you have to 0-2 drinks a day. ? Be aware of how much alcohol is in your drink. In the [place] one drink equals one 12 oz bottle of beer (355 mL), one 5 oz glass of wine (148 mL), or one 1 oz glass of hard liquor (44 mL). General instructions  Schedule regular health, dental, and eye exams.  Stay current with your vaccines.  Tell your health care provider if: ? You often feel depressed. ? You have ever been abused or do not feel safe at home. Summary  Adopting a healthy lifestyle and getting preventive care are important in promoting health and wellness.  Follow your health care provider's instructions about healthy diet, exercising, and getting tested or screened for diseases.  Follow your health care provider's instructions on monitoring your cholesterol and blood pressure. This information is not intended to replace advice given  to you by your health care provider. Make sure you discuss any questions you have with your health care provider. Document Revised: 12/16/2017 Document Reviewed: 12/16/2017 Elsevier Patient Education  2020 [employer].

## 2019-11-29 NOTE — Progress Notes (Signed)
Subjective:    Patient ID: Caleb Jones [facility], male    DOB: 06/24/1950, 69 y.o.   MRN: [id number]  HPI He is here for a physical exam.  He has felt the balance issues since mid-September.   He feels like he has to be slow and steady and can not rush.  He is unable to describe why or how he feels with the balance problem.  He denies any lightheadedness or dizziness in his head.  He denies any numbness or tingling.  He denies back pain and knee pain.  He does not exercise regularly.  His eyes are not good, but is not sure if it is related to his eyes.  He is unsure if it is related to changes in position or quick movement.  He just seems to lose his balance.  He went on a cruise the beginning of October and felt motion sickness, but never felt that shift moved.  He has never had that before and was concerned why he was feeling it then.   In the past month or so he has had some shortness of breath with exertion.  Again he is not exercising on a regular basis and he has gained weight.  He denies any coughing or wheezing.     Medications and allergies reviewed with patient and updated if appropriate.  Patient Active Problem List   Diagnosis Date Noted  . Reactive depression 05/14/2016  . Hashimoto's thyroiditis 09/01/2015  . 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 Palpitations 06/20/2015  . Food impaction of esophagus 04/25/2014  . Esophageal stricture 04/25/2014  . Sleep disorder 12/21/2013  . Anxiety 01/22/2009  . PROSTATE CANCER, HX OF 01/11/2009    Current Outpatient Medications on File Prior to Visit  Medication Sig Dispense Refill  . acetaminophen (TYLENOL) 500 MG tablet Take 500 mg by mouth at bedtime as needed.    . clonazePAM (KLONOPIN) 0.5 MG tablet TAKE 1 TABLET BY MOUTH EVERY DAY AT BEDTIME CAN TAKE ADDITIONAL TAB DURING DAY IF NEEDED 60 tablet 0  . Fish Oil-Cholecalciferol (OMEGA-3 + VITAMIN D3 PO) Take by mouth daily.    [name] levothyroxine (SYNTHROID) 50 MCG tablet Take 1 tablet (50  mcg total) by mouth daily. 90 tablet 3  . Multiple Vitamins-Minerals (MULTIVITAMIN ADULT PO) Take by mouth 1 day or 1 dose.    [name] omeprazole (PRILOSEC) 20 MG capsule TAKE 1 CAPSULE BY MOUTH EVERY DAY 90 capsule 3  . sertraline (ZOLOFT) 100 MG tablet TAKE 1 TABLET BY MOUTH EVERY DAY 90 tablet 0  . b complex vitamins capsule Take by mouth. (Patient not taking: Reported on 11/30/2019)    . fluorometholone (FML) 0.1 % ophthalmic suspension SMARTSIG:In Eye(s) (Patient not taking: Reported on 11/30/2019)     No current facility-administered medications on file prior to visit.    Past Medical History:  Diagnosis Date  . Anxiety   . Cancer ([place])   . Depression   . Esophageal fissure   . Esophageal stricture   . GERD (gastroesophageal reflux disease)   . History of prostate cancer 11/20/2008   Dr Alinda Money  . Internal hemorrhoids   . Pneumonia 01/2009   pleural effusion tapped  . Thyroid disease     Past Surgical History:  Procedure Laterality Date  . COLONOSCOPY  04/2008   Dr Sharlett Iles  . ESOPHAGOGASTRODUODENOSCOPY N/A 04/25/2014   Procedure: ESOPHAGOGASTRODUODENOSCOPY (EGD);  Surgeon: Irene Shipper, MD;  Location: [facility];  Service: Endoscopy;  Laterality: N/A;  . PROSTATECTOMY  11/20/2008  Dr Dutch Gray  . REFRACTIVE SURGERY  2007   for Retina Dethachment, Dr Jalene Mullet , [facility]  . TONSILLECTOMY      Social History   Socioeconomic History  . Marital status: Single    Spouse name: Not on file  . Number of children: 0  . Years of education: Not on file  . Highest education level: Not on file  Occupational History  . Occupation: Lobbyist  Tobacco Use  . Smoking status: Never Smoker  . Smokeless tobacco: Never Used  Vaping Use  . Vaping Use: Never used  Substance and Sexual Activity  . Alcohol use: Yes    Alcohol/week: 1.0 standard drink    Types: 1 Cans of beer per week    Comment: Socially  . Drug use: No  . Sexual activity: Not on file  Other Topics Concern   . Not on file  Social History Narrative   Exercise: walking regularly   Social Determinants of Health   Financial Resource Strain:   . Difficulty of Paying Living Expenses: Not on file  Food Insecurity:   . Worried About Charity fundraiser in the Last Year: Not on file  . Ran Out of Food in the Last Year: Not on file  Transportation Needs:   . Lack of Transportation (Medical): Not on file  . Lack of Transportation (Non-Medical): Not on file  Physical Activity:   . Days of Exercise per Week: Not on file  . Minutes of Exercise per Session: Not on file  Stress:   . Feeling of Stress : Not on file  Social Connections:   . Frequency of Communication with Friends and Family: Not on file  . Frequency of Social Gatherings with Friends and Family: Not on file  . Attends Religious Services: Not on file  . Active Member of Clubs or Organizations: Not on file  . Attends Archivist Meetings: Not on file  . Marital Status: Not on file    Family History  Problem Relation Age of Onset  . Dementia Mother   . Hypertension Mother   . Transient ischemic attack Mother        > 58  . Cervical cancer Mother   . Prostate cancer Father 64        metastatic to bones, lungs & cns  . Breast cancer Sister        <50  . Diabetes Maternal Aunt   . Heart disease Neg Hx   . Colon cancer Neg Hx   . Stomach cancer Neg Hx   . Rectal cancer Neg Hx     Review of Systems  Constitutional: Negative for fever.       Generalized weakness  Eyes: Positive for visual disturbance (Changes in vision-up-to-date with eye exams).  Respiratory: Positive for shortness of breath (with walking). Negative for cough and wheezing.   Cardiovascular: Positive for palpitations. Negative for chest pain and leg swelling.  Gastrointestinal: Negative for abdominal pain, blood in stool, constipation, diarrhea and nausea.  Genitourinary: Negative for dysuria.  Musculoskeletal: Negative for arthralgias and back pain.   Skin: Negative for rash.  Neurological: Positive for headaches. Negative for dizziness, weakness, light-headedness and numbness.  Psychiatric/Behavioral: Positive for dysphoric mood. The patient is nervous/anxious.        Objective:   Vitals:   11/30/19 1434  BP: 118/72  Pulse: 60  Temp: 98.4 F (36.9 C)  SpO2: 97%   Filed Weights   11/30/19 1434  Weight: 218  lb (98.9 kg)   Body mass index is 28.96 kg/m.  BP Readings from Last 3 Encounters:  11/30/19 118/72  11/17/19 118/80  12/29/18 134/82    Wt Readings from Last 3 Encounters:  11/30/19 218 lb (98.9 kg)  11/17/19 218 lb 2 oz (98.9 kg)  12/29/18 214 lb (97.1 kg)     Physical Exam Constitutional: He appears well-developed and well-nourished. No distress.  HENT:  Head: Normocephalic and atraumatic.  Right Ear: External ear normal.  Left Ear: External ear normal.  Mouth/Throat: Oropharynx is clear and moist.  Normal ear canals and TM b/l  Eyes: Conjunctivae and EOM are normal.  Neck: Neck supple. No tracheal deviation present. No thyromegaly present.  No carotid bruit  Cardiovascular: Normal rate, regular rhythm, normal heart sounds and intact distal pulses.   No murmur heard. Pulmonary/Chest: Effort normal and breath sounds normal. No respiratory distress. He has no wheezes. He has no rales.  Abdominal: Soft. He exhibits no distension. There is no tenderness.  Genitourinary: deferred  Musculoskeletal: He exhibits no edema.  Lymphadenopathy:   He has no cervical adenopathy.  Skin: Skin is warm and dry. He is not diaphoretic.  Psychiatric: He has a normal mood and affect. His behavior is normal.         Assessment & Plan:   Physical exam: Screening blood work  ordered Immunizations  Flu vac today, discussed shingrix, others up to date Colonoscopy   Up to date  Eye exams   Up to date  Exercise   none Weight  Encouraged weight loss Substance abuse   none  See Problem List for Assessment and Plan of  chronic medical problems.   This visit occurred during the SARS-CoV-2 public health emergency.  Safety protocols were in place, including screening questions prior to the visit, additional usage of staff PPE, and extensive cleaning of exam room while observing appropriate contact time as indicated for disinfecting solutions.

## 2019-11-30 ENCOUNTER — Ambulatory Visit (INDEPENDENT_AMBULATORY_CARE_PROVIDER_SITE_OTHER): Payer: BC Managed Care – PPO

## 2019-11-30 ENCOUNTER — Other Ambulatory Visit: Payer: Self-pay

## 2019-11-30 ENCOUNTER — Ambulatory Visit: Payer: BC Managed Care – PPO | Admitting: Internal Medicine

## 2019-11-30 ENCOUNTER — Encounter: Payer: Self-pay | Admitting: Internal Medicine

## 2019-11-30 VITALS — BP 118/72 | HR 60 | Temp 98.4°F | Ht 72.75 in | Wt 218.0 lb

## 2019-11-30 DIAGNOSIS — E063 Autoimmune thyroiditis: Secondary | ICD-10-CM

## 2019-11-30 DIAGNOSIS — F419 Anxiety disorder, unspecified: Secondary | ICD-10-CM

## 2019-11-30 DIAGNOSIS — R0600 Dyspnea, unspecified: Secondary | ICD-10-CM

## 2019-11-30 DIAGNOSIS — Z8546 Personal history of malignant neoplasm of prostate: Secondary | ICD-10-CM | POA: Diagnosis not present

## 2019-11-30 DIAGNOSIS — Z0000 Encounter for general adult medical examination without abnormal findings: Secondary | ICD-10-CM

## 2019-11-30 DIAGNOSIS — Z23 Encounter for immunization: Secondary | ICD-10-CM | POA: Diagnosis not present

## 2019-11-30 DIAGNOSIS — R0609 Other forms of dyspnea: Secondary | ICD-10-CM

## 2019-11-30 DIAGNOSIS — R2689 Other abnormalities of gait and mobility: Secondary | ICD-10-CM

## 2019-11-30 DIAGNOSIS — F329 Major depressive disorder, single episode, unspecified: Secondary | ICD-10-CM | POA: Diagnosis not present

## 2019-11-30 DIAGNOSIS — K222 Esophageal obstruction: Secondary | ICD-10-CM

## 2019-11-30 LAB — COMPREHENSIVE METABOLIC PANEL
ALT: 14 U/L (ref 0–53)
AST: 21 U/L (ref 0–37)
Albumin: 4.3 g/dL (ref 3.5–5.2)
Alkaline Phosphatase: 67 U/L (ref 39–117)
BUN: 15 mg/dL (ref 6–23)
CO2: 28 mEq/L (ref 19–32)
Calcium: 9 mg/dL (ref 8.4–10.5)
Chloride: 100 mEq/L (ref 96–112)
Creatinine, Ser: 1.35 mg/dL (ref 0.40–1.50)
GFR: 53.57 mL/min — ABNORMAL LOW (ref >60.00)
Glucose, Bld: 83 mg/dL (ref 70–99)
Potassium: 4.1 mEq/L (ref 3.5–5.1)
Sodium: 135 mEq/L (ref 135–145)
Total Bilirubin: 0.5 mg/dL (ref 0.2–1.2)
Total Protein: 7.4 g/dL (ref 6.0–8.3)

## 2019-11-30 LAB — CBC WITH DIFFERENTIAL/PLATELET
Basophils Absolute: 0.1 10*3/uL (ref 0.0–0.1)
Basophils Relative: 1.1 % (ref 0.0–3.0)
Eosinophils Absolute: 0.3 10*3/uL (ref 0.0–0.7)
Eosinophils Relative: 2.8 % (ref 0.0–5.0)
HCT: 43.5 % (ref 39.0–52.0)
Hemoglobin: 14.7 g/dL (ref 13.0–17.0)
Lymphocytes Relative: 28.7 % (ref 12.0–46.0)
Lymphs Abs: 2.8 10*3/uL (ref 0.7–4.0)
MCHC: 33.9 g/dL (ref 30.0–36.0)
MCV: 91.1 fl (ref 78.0–100.0)
Monocytes Absolute: 1.1 10*3/uL — ABNORMAL HIGH (ref 0.1–1.0)
Monocytes Relative: 11.9 % (ref 3.0–12.0)
Neutro Abs: 5.3 10*3/uL (ref 1.4–7.7)
Neutrophils Relative %: 55.5 % (ref 43.0–77.0)
Platelets: 211 10*3/uL (ref 150.0–400.0)
RBC: 4.77 Mil/uL (ref 4.22–5.81)
RDW: 12.8 % (ref 11.5–15.5)
WBC: 9.6 10*3/uL (ref 4.0–10.5)

## 2019-11-30 LAB — LIPID PANEL
Cholesterol: 182 mg/dL (ref 0–200)
HDL: 47.2 mg/dL (ref >39.00)
LDL Cholesterol: 109 mg/dL — ABNORMAL HIGH (ref 0–99)
NonHDL: 134.42
Total CHOL/HDL Ratio: 4
Triglycerides: 129 mg/dL (ref 0.0–149.0)
VLDL: 25.8 mg/dL (ref 0.0–40.0)

## 2019-11-30 LAB — TSH: TSH: 2.38 u[IU]/mL (ref 0.35–4.50)

## 2019-11-30 NOTE — Assessment & Plan Note (Signed)
New problem Has experienced this over the past month or so He admits he is not exercising regularly and has gained weight, which he knows may be contributing Denies any coughing or wheezing We will get chest x-ray Encouraged regular exercise and weight loss-if shortness of breath worsens or does not improve with the above advised him to follow-up with me

## 2019-11-30 NOTE — Assessment & Plan Note (Signed)
Chronic Controlled, stable Continue  sertraline 100 mg daily 

## 2019-11-30 NOTE — Assessment & Plan Note (Signed)
Chronic  Clinically euthyroid Currently taking levothyroxine 50 mcg daily Check tsh  Titrate med dose if needed  

## 2019-11-30 NOTE — Assessment & Plan Note (Signed)
Chronic Overall controlled, but he does have some anxiety Continue sertraline 100 mg daily and clonazepam 1/2-1 pill daily as needed

## 2019-11-30 NOTE — Assessment & Plan Note (Signed)
New problem Started 1-2 months ago Not related to dizziness, lightheadedness, knee pain, nerve issues or back pain ?  Related to his eyes-vision has worsened and changed, deconditioning, quick movements Discussed several causes of worsening balance Discussed referral to neurology, physical therapy, regular exercise At this point he will try to make an effort to start exercising regularly to see if this helps If balance issues persist or worsen advised him to follow-up with me so we can evaluate further

## 2019-11-30 NOTE — Assessment & Plan Note (Signed)
Chronic Denies GERD or difficulty swallowing Continue omeprazole 20 mg daily

## 2019-11-30 NOTE — Assessment & Plan Note (Signed)
Chronic Sees urology annually No evidence of recurrence

## 2019-12-10 ENCOUNTER — Other Ambulatory Visit: Payer: Self-pay | Admitting: Internal Medicine

## 2019-12-23 ENCOUNTER — Other Ambulatory Visit: Payer: BC Managed Care – PPO

## 2019-12-23 DIAGNOSIS — Z20822 Contact with and (suspected) exposure to covid-19: Secondary | ICD-10-CM

## 2019-12-24 LAB — SARS-COV-2, NAA 2 DAY TAT

## 2019-12-24 LAB — NOVEL CORONAVIRUS, NAA: SARS-CoV-2, NAA: NOT DETECTED

## 2020-01-17 ENCOUNTER — Other Ambulatory Visit: Payer: BC Managed Care – PPO

## 2020-01-17 DIAGNOSIS — Z20822 Contact with and (suspected) exposure to covid-19: Secondary | ICD-10-CM

## 2020-01-18 LAB — SARS-COV-2, NAA 2 DAY TAT

## 2020-01-18 LAB — NOVEL CORONAVIRUS, NAA: SARS-CoV-2, NAA: NOT DETECTED

## 2020-01-20 ENCOUNTER — Other Ambulatory Visit: Payer: Self-pay | Admitting: Internal Medicine

## 2020-02-20 ENCOUNTER — Other Ambulatory Visit: Payer: Self-pay | Admitting: Internal Medicine

## 2020-02-23 ENCOUNTER — Encounter: Payer: Self-pay | Admitting: Internal Medicine

## 2020-03-05 ENCOUNTER — Other Ambulatory Visit: Payer: Self-pay | Admitting: Internal Medicine

## 2020-04-12 ENCOUNTER — Ambulatory Visit: Payer: BC Managed Care – PPO | Attending: Critical Care Medicine

## 2020-04-12 DIAGNOSIS — Z20822 Contact with and (suspected) exposure to covid-19: Secondary | ICD-10-CM | POA: Diagnosis not present

## 2020-04-13 ENCOUNTER — Encounter: Payer: Self-pay | Admitting: Internal Medicine

## 2020-04-13 LAB — SARS-COV-2, NAA 2 DAY TAT

## 2020-04-13 LAB — SPECIMEN STATUS REPORT

## 2020-04-13 LAB — NOVEL CORONAVIRUS, NAA: SARS-CoV-2, NAA: DETECTED — AB

## 2020-04-14 ENCOUNTER — Other Ambulatory Visit: Payer: Self-pay | Admitting: Internal Medicine

## 2020-04-14 ENCOUNTER — Telehealth: Payer: Self-pay | Admitting: Physician Assistant

## 2020-04-14 NOTE — Telephone Encounter (Signed)
Called to discuss with Farris Has Vargo about Covid symptoms and the use of bebletivomab, remdisivir or oral therapies for those with mild to moderate Covid symptoms and at a high risk of hospitalization.     Pt is qualified due to co-morbid conditions and/or a member of an at-risk group (age, BMI), however  He is out of the treatment window at this point and doing better.    Patient Active Problem List   Diagnosis Date Noted  . Poor balance 11/30/2019  . Reactive depression 05/14/2016  . Hashimoto's thyroiditis 09/01/2015  . 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 Palpitations 06/20/2015  . Food impaction of esophagus 04/25/2014  . Esophageal stricture 04/25/2014  . Sleep disorder 12/21/2013  . Anxiety 01/22/2009  . DOE (dyspnea on exertion) 01/11/2009  . PROSTATE CANCER, HX OF 01/11/2009    Angelena Form PA-C

## 2020-05-25 ENCOUNTER — Other Ambulatory Visit: Payer: Self-pay | Admitting: Internal Medicine

## 2020-05-29 ENCOUNTER — Telehealth: Payer: Self-pay | Admitting: Internal Medicine

## 2020-05-29 NOTE — Patient Instructions (Addendum)
    Blood work was ordered.      Medications changes include :   none     Please followup in 6 months  

## 2020-05-29 NOTE — Progress Notes (Signed)
Subjective:    Patient ID: Caleb Jones, male    DOB: Jul 08, 1950, 70 y.o.   MRN: [id number]  HPI The patient is here for follow up of their chronic medical problems, including hypothyroidism, anxiety, depression  He had covid April.  Overall he does feel good and he feels he has recovered from this.   He takes advil rarely.  He takes tylenol - 2 tabs at night.  He has not been exercising regularly.  He knows he needs to start back to regular exercise.  Medications and allergies reviewed with patient and updated if appropriate.  Patient Active Problem List   Diagnosis Date Noted  . Poor balance 11/30/2019  . Reactive depression 05/14/2016  . Hashimoto's thyroiditis 09/01/2015  . 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 Palpitations 06/20/2015  . Food impaction of esophagus 04/25/2014  . Esophageal stricture 04/25/2014  . Sleep disorder 12/21/2013  . Anxiety 01/22/2009  . DOE (dyspnea on exertion) 01/11/2009  . PROSTATE CANCER, HX OF 01/11/2009    Current Outpatient Medications on File Prior to Visit  Medication Sig Dispense Refill  . acetaminophen (TYLENOL) 500 MG tablet Take 500 mg by mouth at bedtime as needed.    . clonazePAM (KLONOPIN) 0.5 MG tablet TAKE 1 TABLET BY MOUTH EVERY DAY AT BEDTIME CAN TAKE ADDITIONAL TABLET DURING DAY IF NEEDED 60 tablet 0  . Fish Oil-Cholecalciferol (OMEGA-3 + VITAMIN D3 PO) Take by mouth daily.    [name] levothyroxine (SYNTHROID) 50 MCG tablet TAKE 1 TABLET BY MOUTH EVERY DAY 90 tablet 3  . Multiple Vitamins-Minerals (MULTIVITAMIN ADULT PO) Take by mouth 1 day or 1 dose.    [name] omeprazole (PRILOSEC) 20 MG capsule TAKE 1 CAPSULE BY MOUTH EVERY DAY 90 capsule 3  . sertraline (ZOLOFT) 100 MG tablet TAKE 1 TABLET BY MOUTH EVERY DAY 90 tablet 0   No current facility-administered medications on file prior to visit.    Past Medical History:  Diagnosis Date  . Anxiety   . Cancer ([place])   . Depression   . Esophageal fissure   . Esophageal stricture   .  GERD (gastroesophageal reflux disease)   . History of prostate cancer 11/20/2008   Dr Alinda Money  . Internal hemorrhoids   . Pneumonia 01/2009   pleural effusion tapped  . Thyroid disease     Past Surgical History:  Procedure Laterality Date  . COLONOSCOPY  04/2008   Dr Sharlett Iles  . ESOPHAGOGASTRODUODENOSCOPY N/A 04/25/2014   Procedure: ESOPHAGOGASTRODUODENOSCOPY (EGD);  Surgeon: Irene Shipper, MD;  Location: [facility];  Service: Endoscopy;  Laterality: N/A;  . PROSTATECTOMY  11/20/2008   Dr Dutch Gray  . REFRACTIVE SURGERY  2007   for Retina Dethachment, Dr Jalene Mullet , [facility]  . TONSILLECTOMY      Social History   Socioeconomic History  . Marital status: Single    Spouse name: Not on file  . Number of children: 0  . Years of education: Not on file  . Highest education level: Not on file  Occupational History  . Occupation: Lobbyist  Tobacco Use  . Smoking status: Never Smoker  . Smokeless tobacco: Never Used  Vaping Use  . Vaping Use: Never used  Substance and Sexual Activity  . Alcohol use: Yes    Alcohol/week: 1.0 standard drink    Types: 1 Cans of beer per week    Comment: Socially  . Drug use: No  . Sexual activity: Not on file  Other Topics Concern  . Not on  file  Social History Narrative   Exercise: walking regularly   Social Determinants of Health   Financial Resource Strain: Not on file  Food Insecurity: Not on file  Transportation Needs: Not on file  Physical Activity: Not on file  Stress: Not on file  Social Connections: Not on file    Family History  Problem Relation Age of Onset  . Dementia Mother   . Hypertension Mother   . Transient ischemic attack Mother        > 20  . Cervical cancer Mother   . Prostate cancer Father 24        metastatic to bones, lungs & cns  . Breast cancer Sister        <50  . Diabetes Maternal Aunt   . Heart disease Neg Hx   . Colon cancer Neg Hx   . Stomach cancer Neg Hx   . Rectal cancer Neg Hx      Review of Systems  Constitutional: Negative for chills and fever.  Respiratory: Negative for cough, shortness of breath and wheezing.        Challenge to get a deep breath  Cardiovascular: Negative for chest pain, palpitations and leg swelling.  Neurological: Positive for headaches. Negative for light-headedness.       Objective:   Vitals:   05/30/20 1516  BP: 122/80  Pulse: 67  Temp: 98.3 F (36.8 C)  SpO2: 96%   BP Readings from Last 3 Encounters:  05/30/20 122/80  11/30/19 118/72  11/17/19 118/80   Wt Readings from Last 3 Encounters:  05/30/20 220 lb 3.2 oz (99.9 kg)  11/30/19 218 lb (98.9 kg)  11/17/19 218 lb 2 oz (98.9 kg)   Body mass index is 29.25 kg/m.   Physical Exam    Constitutional: Appears well-developed and well-nourished. No distress.  HENT:  Head: Normocephalic and atraumatic.  Neck: Neck supple. No tracheal deviation present. No thyromegaly present.  No cervical lymphadenopathy Cardiovascular: Normal rate, regular rhythm and normal heart sounds.   No murmur heard. No carotid bruit .  No edema Pulmonary/Chest: Effort normal and breath sounds normal. No respiratory distress. No has no wheezes. No rales.  Skin: Skin is warm and dry. Not diaphoretic.  Psychiatric: Normal mood and affect. Behavior is normal.      Assessment & Plan:    See Problem List for Assessment and Plan of chronic medical problems.    This visit occurred during the SARS-CoV-2 public health emergency.  Safety protocols were in place, including screening questions prior to the visit, additional usage of staff PPE, and extensive cleaning of exam room while observing appropriate contact time as indicated for disinfecting solutions.

## 2020-05-30 ENCOUNTER — Encounter: Payer: Self-pay | Admitting: Internal Medicine

## 2020-05-30 ENCOUNTER — Ambulatory Visit: Payer: BC Managed Care – PPO | Admitting: Internal Medicine

## 2020-05-30 ENCOUNTER — Other Ambulatory Visit: Payer: Self-pay

## 2020-05-30 VITALS — BP 122/80 | HR 67 | Temp 98.3°F | Ht 72.75 in | Wt 220.2 lb

## 2020-05-30 DIAGNOSIS — G479 Sleep disorder, unspecified: Secondary | ICD-10-CM

## 2020-05-30 DIAGNOSIS — F419 Anxiety disorder, unspecified: Secondary | ICD-10-CM | POA: Diagnosis not present

## 2020-05-30 DIAGNOSIS — N1831 Chronic kidney disease, stage 3a: Secondary | ICD-10-CM | POA: Diagnosis not present

## 2020-05-30 DIAGNOSIS — E063 Autoimmune thyroiditis: Secondary | ICD-10-CM

## 2020-05-30 DIAGNOSIS — F329 Major depressive disorder, single episode, unspecified: Secondary | ICD-10-CM

## 2020-05-30 LAB — MICROALBUMIN / CREATININE URINE RATIO
Creatinine,U: 41.3 mg/dL
Microalb Creat Ratio: 10.7 mg/g (ref 0.0–30.0)
Microalb, Ur: 4.4 mg/dL — ABNORMAL HIGH (ref 0.0–1.9)

## 2020-05-30 LAB — URINALYSIS, ROUTINE W REFLEX MICROSCOPIC
Bilirubin Urine: NEGATIVE
Ketones, ur: NEGATIVE
Leukocytes,Ua: NEGATIVE
Nitrite: NEGATIVE
Specific Gravity, Urine: <=1.005 — AB (ref 1.000–1.030)
Total Protein, Urine: NEGATIVE
Urine Glucose: NEGATIVE
Urobilinogen, UA: 0.2 (ref 0.0–1.0)
pH: 6 (ref 5.0–8.0)

## 2020-05-30 LAB — COMPREHENSIVE METABOLIC PANEL
ALT: 14 U/L (ref 0–53)
AST: 17 U/L (ref 0–37)
Albumin: 4.2 g/dL (ref 3.5–5.2)
Alkaline Phosphatase: 61 U/L (ref 39–117)
BUN: 18 mg/dL (ref 6–23)
CO2: 26 mEq/L (ref 19–32)
Calcium: 9.3 mg/dL (ref 8.4–10.5)
Chloride: 103 mEq/L (ref 96–112)
Creatinine, Ser: 1.29 mg/dL (ref 0.40–1.50)
GFR: 56.38 mL/min — ABNORMAL LOW (ref >60.00)
Glucose, Bld: 92 mg/dL (ref 70–99)
Potassium: 4.2 mEq/L (ref 3.5–5.1)
Sodium: 136 mEq/L (ref 135–145)
Total Bilirubin: 0.5 mg/dL (ref 0.2–1.2)
Total Protein: 7.2 g/dL (ref 6.0–8.3)

## 2020-05-30 MED ORDER — CLONAZEPAM 0.5 MG PO TABS: ORAL_TABLET | ORAL | 0 refills | Status: DC

## 2020-05-30 NOTE — Assessment & Plan Note (Signed)
Chronic controlled Continue clonazepam 0.5 mg nightly

## 2020-05-30 NOTE — Assessment & Plan Note (Signed)
New His past few blood test had showed very mild decrease GFR-no obvious cause.  No hypertension or diabetes.  Rarely takes ibuprofen. Encouraged increased water intake, regular exercise, low-sodium diet, Tylenol as needed and avoid NSAIDs when possible CMP, urinalysis, urine microalbumin today

## 2020-05-30 NOTE — Assessment & Plan Note (Signed)
Chronic  Clinically euthyroid Currently taking levothyroxine 50 mcg Check tsh  Titrate med dose if needed  

## 2020-05-30 NOTE — Assessment & Plan Note (Signed)
Chronic Controlled, stable Continue  sertraline 100 mg daily 

## 2020-05-30 NOTE — Assessment & Plan Note (Signed)
Chronic Controlled, stable Continue sertraline 100 mg daily, clonazepam 0.5 mg nightly

## 2020-05-31 ENCOUNTER — Encounter: Payer: Self-pay | Admitting: Internal Medicine

## 2020-05-31 LAB — TSH: TSH: 2.13 u[IU]/mL (ref 0.35–4.50)

## 2020-06-18 ENCOUNTER — Encounter: Payer: Self-pay | Admitting: Internal Medicine

## 2020-06-18 ENCOUNTER — Other Ambulatory Visit: Payer: Self-pay | Admitting: Internal Medicine

## 2020-06-19 ENCOUNTER — Other Ambulatory Visit: Payer: Self-pay

## 2020-06-19 MED ORDER — SERTRALINE HCL 100 MG PO TABS: 1.0000 | ORAL_TABLET | Freq: Every day | ORAL | 0 refills | Status: DC

## 2020-06-19 MED ORDER — LEVOTHYROXINE SODIUM 50 MCG PO TABS: 50.0000 ug | ORAL_TABLET | Freq: Every day | ORAL | 0 refills | Status: DC

## 2020-06-19 MED ORDER — CLONAZEPAM 0.5 MG PO TABS: ORAL_TABLET | ORAL | 0 refills | Status: DC

## 2020-07-12 ENCOUNTER — Other Ambulatory Visit: Payer: Self-pay | Admitting: Internal Medicine

## 2020-08-21 ENCOUNTER — Encounter: Payer: Self-pay | Admitting: Internal Medicine

## 2020-08-22 MED ORDER — CLONAZEPAM 0.5 MG PO TABS: ORAL_TABLET | ORAL | 0 refills | Status: DC

## 2020-09-20 ENCOUNTER — Other Ambulatory Visit: Payer: Self-pay | Admitting: Internal Medicine

## 2020-09-25 ENCOUNTER — Other Ambulatory Visit: Payer: Self-pay | Admitting: Internal Medicine

## 2020-10-04 ENCOUNTER — Other Ambulatory Visit: Payer: Self-pay | Admitting: Internal Medicine

## 2020-10-14 DIAGNOSIS — Z23 Encounter for immunization: Secondary | ICD-10-CM | POA: Diagnosis not present

## 2020-10-29 ENCOUNTER — Ambulatory Visit: Payer: BC Managed Care – PPO | Attending: Internal Medicine

## 2020-10-29 DIAGNOSIS — Z23 Encounter for immunization: Secondary | ICD-10-CM

## 2020-10-29 NOTE — Progress Notes (Signed)
   Covid-19 Vaccination Clinic  Name:  Reyes Aldaco [facility]    MRN: [id number] DOB: January 30, 1950  10/29/2020  Mr. Samples was observed post Covid-19 immunization for 15 minutes without incident. He was provided with Vaccine Information Sheet and instruction to access the V-Safe system.   Mr. Eddinger was instructed to call 911 with any severe reactions post vaccine: Difficulty breathing  Swelling of face and throat  A fast heartbeat  A bad rash all over body  Dizziness and weakness   Immunizations Administered     Name Date Dose VIS Date Route   Pfizer Covid-19 Vaccine Bivalent Booster 10/29/2020  1:06 PM 0.3 mL 09/05/2020 Intramuscular   Manufacturer: [place]   Lot: [id number]   [place]: [phone]

## 2020-11-16 ENCOUNTER — Other Ambulatory Visit: Payer: Self-pay | Admitting: Internal Medicine

## 2020-11-23 ENCOUNTER — Other Ambulatory Visit (HOSPITAL_BASED_OUTPATIENT_CLINIC_OR_DEPARTMENT_OTHER): Payer: Self-pay

## 2020-11-23 MED ORDER — PFIZER COVID-19 VAC BIVALENT 30 MCG/0.3ML IM SUSP
INTRAMUSCULAR | 0 refills | Status: DC
Start: 2020-10-29 — End: 2020-12-02
  Filled 2020-11-23: qty 0.3, 1d supply, fill #0

## 2020-12-02 ENCOUNTER — Encounter: Payer: Self-pay | Admitting: Internal Medicine

## 2020-12-02 NOTE — Patient Instructions (Addendum)
  Blood work was ordered.     Medications changes include :  none    A referral was ordered for podiatry - [facility].        Someone from their office will call you to schedule an appointment.    Please followup in 6 months

## 2020-12-02 NOTE — Progress Notes (Signed)
Subjective:    Patient ID: Caleb Jones [facility], male    DOB: 1950-06-13, 70 y.o.   MRN: [id number]  This visit occurred during the SARS-CoV-2 public health emergency.  Safety protocols were in place, including screening questions prior to the visit, additional usage of staff PPE, and extensive cleaning of exam room while observing appropriate contact time as indicated for disinfecting solutions.     HPI The patient is here for follow up of their chronic medical problems, including hypothyroidism, CKD, anxiety, depression   Pain in left foot lateral plantar ball of foot.  Intermitent shar pain.  Was wearing dress shoes that are narrow.  Stared wearing sneakers - wide and doing stretches.  No N/T.    Medications and allergies reviewed with patient and updated if appropriate.  Patient Active Problem List   Diagnosis Date Noted   Left foot pain 12/03/2020   Stage 3a chronic kidney disease ([place]) 05/30/2020   Poor balance 11/30/2019   Reactive depression 05/14/2016   Hashimoto's thyroiditis 09/01/2015   Keratoconus 06/20/2015   Palpitations 06/20/2015   Food impaction of esophagus 04/25/2014   Esophageal stricture 04/25/2014   Sleep disorder 12/21/2013   Anxiety 01/22/2009   PROSTATE CANCER, HX OF 01/11/2009    Current Outpatient Medications on File Prior to Visit  Medication Sig Dispense Refill   acetaminophen (TYLENOL) 500 MG tablet Take 500 mg by mouth at bedtime as needed.     clonazePAM (KLONOPIN) 0.5 MG tablet TAKE 1/2 TABLET BY MOUTH IN THE MORNING AND 1 TABLET IN THE EVENING EVERY DAY 60 tablet 0   Fish Oil-Cholecalciferol (OMEGA-3 + VITAMIN D3 PO) Take by mouth daily.     levothyroxine (SYNTHROID) 50 MCG tablet Take 1 tablet (50 mcg total) by mouth daily. 30 tablet 0   Multiple Vitamins-Minerals (MULTIVITAMIN ADULT PO) Take by mouth 1 day or 1 dose.     omeprazole (PRILOSEC) 20 MG capsule TAKE 1 CAPSULE BY MOUTH EVERY DAY 90 capsule 3   sertraline (ZOLOFT) 100 MG  tablet TAKE 1 TABLET BY MOUTH EVERY DAY 90 tablet 0   No current facility-administered medications on file prior to visit.    Past Medical History:  Diagnosis Date   Anxiety    Cancer ([place])    Depression    Esophageal fissure    Esophageal stricture    GERD (gastroesophageal reflux disease)    History of prostate cancer 11/20/2008   Dr Alinda Money   Internal hemorrhoids    Pneumonia 01/2009   pleural effusion tapped   Thyroid disease     Past Surgical History:  Procedure Laterality Date   COLONOSCOPY  04/2008   Dr Sharlett Iles   ESOPHAGOGASTRODUODENOSCOPY N/A 04/25/2014   Procedure: ESOPHAGOGASTRODUODENOSCOPY (EGD);  Surgeon: Irene Shipper, MD;  Location: [facility];  Service: Endoscopy;  Laterality: N/A;   PROSTATECTOMY  11/20/2008   Dr Dutch Gray   REFRACTIVE SURGERY  2007   for Retina Dethachment, Dr Jalene Mullet , [facility]   TONSILLECTOMY      Social History   Socioeconomic History   Marital status: Single    Spouse name: Not on file   Number of children: 0   Years of education: Not on file   Highest education level: Not on file  Occupational History   Occupation: Lobbyist  Tobacco Use   Smoking status: Never   Smokeless tobacco: Never  Vaping Use   Vaping Use: Never used  Substance and Sexual Activity   Alcohol use: Yes  Alcohol/week: 1.0 standard drink    Types: 1 Cans of beer per week    Comment: Socially   Drug use: No   Sexual activity: Not on file  Other Topics Concern   Not on file  Social History Narrative   Exercise: walking regularly   Social Determinants of Health   Financial Resource Strain: Not on file  Food Insecurity: Not on file  Transportation Needs: Not on file  Physical Activity: Not on file  Stress: Not on file  Social Connections: Not on file    Family History  Problem Relation Age of Onset   Dementia Mother    Hypertension Mother    Transient ischemic attack Mother        > 53   Cervical cancer Mother    Prostate  cancer Father 54        metastatic to bones, lungs & cns   Breast cancer Sister        <50   Diabetes Maternal Aunt    Heart disease Neg Hx    Colon cancer Neg Hx    Stomach cancer Neg Hx    Rectal cancer Neg Hx     Review of Systems  Constitutional:  Negative for fever.  Respiratory:  Negative for cough, shortness of breath and wheezing.   Cardiovascular:  Negative for chest pain, palpitations and leg swelling.  Neurological:  Positive for headaches (eye related). Negative for light-headedness.      Objective:   Vitals:   12/03/20 1512  BP: 118/80  Pulse: 60  Temp: 98.5 F (36.9 C)  SpO2: 95%   BP Readings from Last 3 Encounters:  12/03/20 118/80  05/30/20 122/80  11/30/19 118/72   Wt Readings from Last 3 Encounters:  12/03/20 220 lb (99.8 kg)  05/30/20 220 lb 3.2 oz (99.9 kg)  11/30/19 218 lb (98.9 kg)   Body mass index is 29.23 kg/m.   Physical Exam    Constitutional: Appears well-developed and well-nourished. No distress.  HENT:  Head: Normocephalic and atraumatic.  Neck: Neck supple. No tracheal deviation present. No thyromegaly present.  No cervical lymphadenopathy Cardiovascular: Normal rate, regular rhythm and normal heart sounds.   No murmur heard. No carotid bruit .  No edema Pulmonary/Chest: Effort normal and breath sounds normal. No respiratory distress. No has no wheezes. No rales. Musculoskeletal: Left foot with lateral bunion with tenderness on plantar surface of bunion Skin: Skin is warm and dry. Not diaphoretic.  Psychiatric: Normal mood and affect. Behavior is normal.      Assessment & Plan:    See Problem List for Assessment and Plan of chronic medical problems.

## 2020-12-03 ENCOUNTER — Ambulatory Visit: Payer: BC Managed Care – PPO | Admitting: Internal Medicine

## 2020-12-03 ENCOUNTER — Other Ambulatory Visit: Payer: Self-pay

## 2020-12-03 VITALS — BP 118/80 | HR 60 | Temp 98.5°F | Ht 72.75 in | Wt 220.0 lb

## 2020-12-03 DIAGNOSIS — N1831 Chronic kidney disease, stage 3a: Secondary | ICD-10-CM | POA: Diagnosis not present

## 2020-12-03 DIAGNOSIS — F419 Anxiety disorder, unspecified: Secondary | ICD-10-CM | POA: Diagnosis not present

## 2020-12-03 DIAGNOSIS — E063 Autoimmune thyroiditis: Secondary | ICD-10-CM | POA: Diagnosis not present

## 2020-12-03 DIAGNOSIS — F329 Major depressive disorder, single episode, unspecified: Secondary | ICD-10-CM | POA: Diagnosis not present

## 2020-12-03 DIAGNOSIS — M79672 Pain in left foot: Secondary | ICD-10-CM | POA: Insufficient documentation

## 2020-12-03 LAB — BASIC METABOLIC PANEL
BUN: 11 mg/dL (ref 6–23)
CO2: 27 mEq/L (ref 19–32)
Calcium: 9.2 mg/dL (ref 8.4–10.5)
Chloride: 101 mEq/L (ref 96–112)
Creatinine, Ser: 1.37 mg/dL (ref 0.40–1.50)
GFR: 52.26 mL/min — ABNORMAL LOW (ref >60.00)
Glucose, Bld: 81 mg/dL (ref 70–99)
Potassium: 4.2 mEq/L (ref 3.5–5.1)
Sodium: 137 mEq/L (ref 135–145)

## 2020-12-03 LAB — TSH: TSH: 3.5 u[IU]/mL (ref 0.35–5.50)

## 2020-12-03 NOTE — Assessment & Plan Note (Signed)
Chronic  Clinically euthyroid Currently taking levothyroxine 50 mcg daily Check tsh  Titrate med dose if needed  

## 2020-12-03 NOTE — Assessment & Plan Note (Signed)
Chronic Drinking plenty of water CMP today

## 2020-12-03 NOTE — Assessment & Plan Note (Signed)
Acute Has been experiencing left lateral plantar foot pain for a while Typically wears dress shoes to work but on narrowing and this makes it worse-better with sneakers or wide shoes He does have a left lateral bunion and bony prominence on the plantar surface which is likely the source of his pain versus possible neuroma, capsulitis Will refer to podiatry for further evaluation Stressed wearing wide shoes consistently

## 2020-12-03 NOTE — Assessment & Plan Note (Signed)
Chronic Controlled, Stable Continue sertraline 100 mg daily 

## 2020-12-03 NOTE — Assessment & Plan Note (Addendum)
Chronic Controlled, Stable Continue sertraline 100 mg daily 

## 2020-12-10 ENCOUNTER — Other Ambulatory Visit: Payer: Self-pay

## 2020-12-10 ENCOUNTER — Ambulatory Visit (INDEPENDENT_AMBULATORY_CARE_PROVIDER_SITE_OTHER): Payer: BC Managed Care – PPO

## 2020-12-10 ENCOUNTER — Ambulatory Visit (INDEPENDENT_AMBULATORY_CARE_PROVIDER_SITE_OTHER): Payer: BC Managed Care – PPO | Admitting: Podiatry

## 2020-12-10 ENCOUNTER — Encounter: Payer: Self-pay | Admitting: Podiatry

## 2020-12-10 DIAGNOSIS — M7750 Other enthesopathy of unspecified foot: Secondary | ICD-10-CM

## 2020-12-10 DIAGNOSIS — Q667 Congenital pes cavus, unspecified foot: Secondary | ICD-10-CM

## 2020-12-10 DIAGNOSIS — M21622 Bunionette of left foot: Secondary | ICD-10-CM

## 2020-12-10 DIAGNOSIS — M7752 Other enthesopathy of left foot: Secondary | ICD-10-CM | POA: Diagnosis not present

## 2020-12-10 NOTE — Progress Notes (Signed)
  Subjective:  Patient ID: Caleb Jones [facility], male    DOB: October 18, 1950,  MRN: [id number]  Chief Complaint  Patient presents with   Foot Pain    (np)Left foot pain    70 y.o. male presents with the above complaint. History confirmed with patient.  Complains of pain on the outside of his foot by the fifth toe, bothers him in shoe gear especially  Objective:  Physical Exam: warm, good capillary refill, no trophic changes or ulcerative lesions, normal DP and PT pulses, and normal sensory exam.  He has a pes cavus foot type bilateral Left Foot: Tailor's bunion lateral, fluctuant tender area under the fifth metatarsal head   No images are attached to the encounter.  Radiographs: Multiple views x-ray of the left foot: Tailor's bunion and pes cavus foot type Assessment:   1. Bursitis of left foot   2. Tailor's bunion of left foot      Plan:  Patient was evaluated and treated and all questions answered.  Discussed with him I suspect he likely has a submetatarsal bursa inflamed with risk factors of pes cavus foot deformity and tailor's bunion.  Discussed offloading this with felt padding, place a dancers pad in his shoe to alleviate pressure here.  Also recommended corticosteroid injection which performed today following sterile prep with alcohol I injected 5 mg of Kenalog and 4 mg of dexamethasone.  He tolerated this well.  We also discussed surgery correction of tailor's bunion deformity if it is not improving   Return if symptoms worsen or fail to improve.

## 2020-12-21 ENCOUNTER — Other Ambulatory Visit: Payer: Self-pay | Admitting: Internal Medicine

## 2020-12-28 ENCOUNTER — Other Ambulatory Visit: Payer: Self-pay | Admitting: Internal Medicine

## 2021-01-01 ENCOUNTER — Other Ambulatory Visit: Payer: Self-pay | Admitting: Internal Medicine

## 2021-01-01 MED ORDER — CLONAZEPAM 0.5 MG PO TABS: ORAL_TABLET | ORAL | 0 refills | Status: DC

## 2021-01-04 ENCOUNTER — Other Ambulatory Visit: Payer: Self-pay | Admitting: Internal Medicine

## 2021-01-16 ENCOUNTER — Other Ambulatory Visit: Payer: Self-pay | Admitting: Internal Medicine

## 2021-02-06 ENCOUNTER — Other Ambulatory Visit: Payer: Self-pay | Admitting: Internal Medicine

## 2021-02-11 ENCOUNTER — Other Ambulatory Visit: Payer: Self-pay | Admitting: Internal Medicine

## 2021-02-11 DIAGNOSIS — Z20822 Contact with and (suspected) exposure to covid-19: Secondary | ICD-10-CM | POA: Diagnosis not present

## 2021-02-11 DIAGNOSIS — Z03818 Encounter for observation for suspected exposure to other biological agents ruled out: Secondary | ICD-10-CM | POA: Diagnosis not present

## 2021-02-11 DIAGNOSIS — R5383 Other fatigue: Secondary | ICD-10-CM | POA: Diagnosis not present

## 2021-02-11 DIAGNOSIS — J0140 Acute pansinusitis, unspecified: Secondary | ICD-10-CM | POA: Diagnosis not present

## 2021-02-12 ENCOUNTER — Other Ambulatory Visit: Payer: Self-pay | Admitting: Internal Medicine

## 2021-02-12 MED ORDER — CLONAZEPAM 0.5 MG PO TABS: ORAL_TABLET | ORAL | 0 refills | Status: DC

## 2021-03-18 ENCOUNTER — Encounter: Payer: Self-pay | Admitting: Internal Medicine

## 2021-03-18 NOTE — Progress Notes (Unsigned)
° ° °  Subjective:    Patient ID: Voris Tigert [facility], male    DOB: 03-31-1950, 71 y.o.   MRN: [id number]  This visit occurred during the SARS-CoV-2 public health emergency.  Safety protocols were in place, including screening questions prior to the visit, additional usage of staff PPE, and extensive cleaning of exam room while observing appropriate contact time as indicated for disinfecting solutions.    HPI Kolten is here for No chief complaint on file.    2/6-went to urgent care for sinus infection.  At that time flu and COVID test were negative.  He was treated with 7 days of an antibiotic, which helped but did not clear up the infection.  He is still having symptoms, including a severe sore throat.      Medications and allergies reviewed with patient and updated if appropriate.  Current Outpatient Medications on File Prior to Visit  Medication Sig Dispense Refill   acetaminophen (TYLENOL) 500 MG tablet Take 500 mg by mouth at bedtime as needed.     clonazePAM (KLONOPIN) 0.5 MG tablet TAKE 1/2 TABLET BY MOUTH IN THE MORNING AND 1 TABLET IN THE EVENING EVERY DAY 60 tablet 0   clonazePAM (KLONOPIN) 0.5 MG tablet TAKE 1/2 TABLET BY MOUTH IN THE MORNING AND 1 TABLET IN THE EVENING 60 tablet 0   Fish Oil-Cholecalciferol (OMEGA-3 + VITAMIN D3 PO) Take by mouth daily.     levothyroxine (SYNTHROID) 50 MCG tablet TAKE 1 TABLET BY MOUTH EVERY DAY 90 tablet 1   Multiple Vitamins-Minerals (MULTIVITAMIN ADULT PO) Take by mouth 1 day or 1 dose.     omeprazole (PRILOSEC) 20 MG capsule TAKE 1 CAPSULE BY MOUTH EVERY DAY; office visit for further refills 90 capsule 0   sertraline (ZOLOFT) 100 MG tablet TAKE 1 TABLET BY MOUTH EVERY DAY 90 tablet 0   No current facility-administered medications on file prior to visit.    Review of Systems     Objective:  There were no vitals filed for this visit. BP Readings from Last 3 Encounters:  12/03/20 118/80  05/30/20 122/80  11/30/19 118/72   Wt  Readings from Last 3 Encounters:  12/03/20 220 lb (99.8 kg)  05/30/20 220 lb 3.2 oz (99.9 kg)  11/30/19 218 lb (98.9 kg)   There is no height or weight on file to calculate BMI.    Physical Exam         Assessment & Plan:    See Problem List for Assessment and Plan of chronic medical problems.

## 2021-03-18 NOTE — Patient Instructions (Addendum)
° ° ° °  Medications changes include :      Your prescription(s) have been sent to your pharmacy.     Return if symptoms worsen or fail to improve.

## 2021-03-19 ENCOUNTER — Other Ambulatory Visit: Payer: Self-pay | Admitting: Internal Medicine

## 2021-03-19 ENCOUNTER — Other Ambulatory Visit: Payer: Self-pay

## 2021-03-19 ENCOUNTER — Ambulatory Visit: Payer: BC Managed Care – PPO | Admitting: Internal Medicine

## 2021-03-19 VITALS — BP 120/78 | HR 87 | Temp 98.4°F | Ht 72.75 in | Wt 210.6 lb

## 2021-03-19 DIAGNOSIS — F419 Anxiety disorder, unspecified: Secondary | ICD-10-CM

## 2021-03-19 DIAGNOSIS — N1831 Chronic kidney disease, stage 3a: Secondary | ICD-10-CM | POA: Diagnosis not present

## 2021-03-19 DIAGNOSIS — Z131 Encounter for screening for diabetes mellitus: Secondary | ICD-10-CM | POA: Diagnosis not present

## 2021-03-19 DIAGNOSIS — R7303 Prediabetes: Secondary | ICD-10-CM | POA: Insufficient documentation

## 2021-03-19 DIAGNOSIS — J0100 Acute maxillary sinusitis, unspecified: Secondary | ICD-10-CM | POA: Diagnosis not present

## 2021-03-19 LAB — BASIC METABOLIC PANEL
BUN: 11 mg/dL (ref 6–23)
CO2: 27 mEq/L (ref 19–32)
Calcium: 9.1 mg/dL (ref 8.4–10.5)
Chloride: 102 mEq/L (ref 96–112)
Creatinine, Ser: 1.33 mg/dL (ref 0.40–1.50)
GFR: 54.05 mL/min — ABNORMAL LOW (ref >60.00)
Glucose, Bld: 86 mg/dL (ref 70–99)
Potassium: 3.8 mEq/L (ref 3.5–5.1)
Sodium: 137 mEq/L (ref 135–145)

## 2021-03-19 LAB — HEMOGLOBIN A1C: Hgb A1c MFr Bld: 5.8 % (ref 4.6–6.5)

## 2021-03-19 MED ORDER — DOXYCYCLINE HYCLATE 100 MG PO TABS: 100.0000 mg | ORAL_TABLET | Freq: Two times a day (BID) | ORAL | 0 refills | Status: AC

## 2021-03-19 MED ORDER — CLONAZEPAM 0.5 MG PO TABS: ORAL_TABLET | ORAL | 0 refills | Status: DC

## 2021-03-19 NOTE — Assessment & Plan Note (Signed)
Chronic ?He has mild chronic kidney disease that has been stable ?Discussed possible causes-unsure exactly what the cause of his mild CKD is ?Discussed avoiding NSAIDs and drinking plenty of water.  He has stopped drinking soda and rarely drinks alcohol now ?Discussed importance of healthy diet, regular exercise ?He does need the omeprazole and at this point is not able to come off of it ?This diagnosis is making him very anxious.  It would be nice to know the reason for his chronic kidney disease as well ?Will order renal ultrasound and refer to nephrology ?BMP today, A1c to rule out diabetes/prediabetes ?

## 2021-03-19 NOTE — Assessment & Plan Note (Signed)
Chronic ?Does have increased anxiety-mostly related to his chronic kidney disease at this point ?He does feel like his medication is working and does not feel like it is necessary to make any changes, but knows that if he is able to be reassured about his chronic kidney disease that may help his anxiety most ?Continue sertraline 100 mg daily ?Continue clonazepam 0.25 mg 0.5 mg in the evening ?

## 2021-03-19 NOTE — Assessment & Plan Note (Signed)
Acute ?Was treated for sinus infection just over a month ago and it did help, but had residual symptoms and is still having some symptoms now ?I do think he has some residual infection ?Start doxycycline 100 mg twice daily x5 days ?Continue over-the-counter cold medications ?Call if symptoms do not completely resolve ?

## 2021-03-24 ENCOUNTER — Other Ambulatory Visit: Payer: Self-pay | Admitting: Internal Medicine

## 2021-03-25 ENCOUNTER — Other Ambulatory Visit: Payer: Self-pay | Admitting: Internal Medicine

## 2021-03-25 ENCOUNTER — Ambulatory Visit
Admission: RE | Admit: 2021-03-25 | Discharge: 2021-03-25 | Disposition: A | Discharge status: Home / Self Care | Payer: BC Managed Care – PPO | Source: Ambulatory Visit | Attending: Internal Medicine | Admitting: Internal Medicine

## 2021-03-25 ENCOUNTER — Other Ambulatory Visit: Payer: Self-pay

## 2021-03-25 DIAGNOSIS — N189 Chronic kidney disease, unspecified: Secondary | ICD-10-CM | POA: Diagnosis not present

## 2021-03-25 DIAGNOSIS — N1831 Chronic kidney disease, stage 3a: Secondary | ICD-10-CM

## 2021-03-29 NOTE — Telephone Encounter (Signed)
Left message for patient to let me know if he was able to pick up refill. ?

## 2021-04-01 ENCOUNTER — Other Ambulatory Visit: Payer: Self-pay | Admitting: Internal Medicine

## 2021-04-05 DIAGNOSIS — H2513 Age-related nuclear cataract, bilateral: Secondary | ICD-10-CM | POA: Diagnosis not present

## 2021-04-05 DIAGNOSIS — H18613 Keratoconus, stable, bilateral: Secondary | ICD-10-CM | POA: Diagnosis not present

## 2021-04-05 DIAGNOSIS — H4423 Degenerative myopia, bilateral: Secondary | ICD-10-CM | POA: Diagnosis not present

## 2021-04-16 MED ORDER — CLONAZEPAM 0.5 MG PO TABS: ORAL_TABLET | ORAL | 5 refills | Status: DC

## 2021-04-16 NOTE — Telephone Encounter (Signed)
Check [place] registry last filled 03/27/2021,,, Pls advise on refill..Chryl Heck ? ?

## 2021-04-18 DIAGNOSIS — H18613 Keratoconus, stable, bilateral: Secondary | ICD-10-CM | POA: Diagnosis not present

## 2021-04-18 DIAGNOSIS — H4423 Degenerative myopia, bilateral: Secondary | ICD-10-CM | POA: Diagnosis not present

## 2021-04-23 DIAGNOSIS — H18613 Keratoconus, stable, bilateral: Secondary | ICD-10-CM | POA: Diagnosis not present

## 2021-05-06 ENCOUNTER — Other Ambulatory Visit: Payer: Self-pay | Admitting: Internal Medicine

## 2021-06-02 ENCOUNTER — Encounter: Payer: Self-pay | Admitting: Internal Medicine

## 2021-06-02 NOTE — Progress Notes (Unsigned)
Subjective:    Patient ID: Caleb Jones [facility], male    DOB: January 22, 1950, 71 y.o.   MRN: [id number]     HPI Caleb Jones is here for follow up of his chronic medical problems, including hypothyroidism, CKD, anxiety, depression, prediabetes, insomnia  He went to [facility] today.  Overall he is doing well.  He does have 3 cruises coming up within the next 6  months or so and wanted to talk about options for motion sickness.  Medications and allergies reviewed with patient and updated if appropriate.  Current Outpatient Medications on File Prior to Visit  Medication Sig Dispense Refill   acetaminophen (TYLENOL) 500 MG tablet Take 500 mg by mouth at bedtime as needed.     clonazePAM (KLONOPIN) 0.5 MG tablet TAKE 1/2 TABLET BY MOUTH IN THE MORNING AND 1 TABLET IN THE EVENING 60 tablet 0   Fish Oil-Cholecalciferol (OMEGA-3 + VITAMIN D3 PO) Take by mouth daily.     levothyroxine (SYNTHROID) 50 MCG tablet TAKE 1 TABLET BY MOUTH EVERY DAY 90 tablet 1   Multiple Vitamins-Minerals (MULTIVITAMIN ADULT PO) Take by mouth 1 day or 1 dose.     omeprazole (PRILOSEC) 20 MG capsule TAKE 1 CAPSULE BY MOUTH EVERY DAY; office visit for further refills 90 capsule 0   sertraline (ZOLOFT) 100 MG tablet TAKE 1 TABLET BY MOUTH EVERY DAY 90 tablet 0   No current facility-administered medications on file prior to visit.     Review of Systems  Constitutional:  Negative for fever.  HENT:  Negative for trouble swallowing.   Respiratory:  Negative for cough, shortness of breath and wheezing.   Cardiovascular:  Negative for chest pain, palpitations and leg swelling.  Gastrointestinal:  Negative for nausea.       No gerd  Neurological:  Positive for headaches. Negative for light-headedness.      Objective:   Vitals:   06/04/21 1530  BP: 110/80  Pulse: 70  Temp: 97.9 F (36.6 C)  SpO2: 95%   BP Readings from Last 3 Encounters:  06/04/21 110/80  03/19/21 120/78  12/03/20 118/80   Wt  Readings from Last 3 Encounters:  06/04/21 213 lb (96.6 kg)  03/19/21 210 lb 9.6 oz (95.5 kg)  12/03/20 220 lb (99.8 kg)   Body mass index is 28.3 kg/m.    Physical Exam Constitutional:      General: He is not in acute distress.    Appearance: Normal appearance. He is not ill-appearing.  HENT:     Head: Normocephalic and atraumatic.  Eyes:     Conjunctiva/sclera: Conjunctivae normal.  Cardiovascular:     Rate and Rhythm: Normal rate and regular rhythm.     Heart sounds: Normal heart sounds. No murmur heard. Pulmonary:     Effort: Pulmonary effort is normal. No respiratory distress.     Breath sounds: Normal breath sounds. No wheezing or rales.  Musculoskeletal:     Right lower leg: No edema.     Left lower leg: No edema.  Skin:    General: Skin is warm and dry.     Findings: No rash.  Neurological:     Mental Status: He is alert. Mental status is at baseline.  Psychiatric:        Mood and Affect: Mood normal.       Lab Results  Component Value Date   WBC 9.6 11/30/2019   HGB 14.7 11/30/2019   HCT 43.5 11/30/2019   PLT 211.0  11/30/2019   GLUCOSE 86 03/19/2021   CHOL 182 11/30/2019   TRIG 129.0 11/30/2019   HDL 47.20 11/30/2019   LDLCALC 109 (H) 11/30/2019   ALT 14 05/30/2020   AST 17 05/30/2020   NA 137 03/19/2021   K 3.8 03/19/2021   CL 102 03/19/2021   CREATININE 1.33 03/19/2021   BUN 11 03/19/2021   CO2 27 03/19/2021   TSH 3.50 12/03/2020   PSA 0.01 Repeated and verified X2. (L) 08/05/2010   HGBA1C 5.8 03/19/2021   MICROALBUR 4.4 (H) 05/30/2020     Assessment & Plan:    See Problem List for Assessment and Plan of chronic medical problems.

## 2021-06-02 NOTE — Patient Instructions (Addendum)
     Blood work was ordered.     Medications changes include :      Your prescription(s) have been sent to your pharmacy.    A referral was ordered for XX.     Someone from that office will call you to schedule an appointment.    Return in about 6 months (around 12/05/2021) for follow up.

## 2021-06-04 ENCOUNTER — Ambulatory Visit: Payer: BC Managed Care – PPO | Admitting: Internal Medicine

## 2021-06-04 VITALS — BP 110/80 | HR 70 | Temp 97.9°F | Ht 72.75 in | Wt 213.0 lb

## 2021-06-04 DIAGNOSIS — R310 Gross hematuria: Secondary | ICD-10-CM | POA: Diagnosis not present

## 2021-06-04 DIAGNOSIS — Z8546 Personal history of malignant neoplasm of prostate: Secondary | ICD-10-CM | POA: Diagnosis not present

## 2021-06-04 DIAGNOSIS — K219 Gastro-esophageal reflux disease without esophagitis: Secondary | ICD-10-CM | POA: Diagnosis not present

## 2021-06-04 DIAGNOSIS — G479 Sleep disorder, unspecified: Secondary | ICD-10-CM

## 2021-06-04 DIAGNOSIS — N1831 Chronic kidney disease, stage 3a: Secondary | ICD-10-CM

## 2021-06-04 DIAGNOSIS — R7303 Prediabetes: Secondary | ICD-10-CM

## 2021-06-04 DIAGNOSIS — E063 Autoimmune thyroiditis: Secondary | ICD-10-CM | POA: Diagnosis not present

## 2021-06-04 DIAGNOSIS — K222 Esophageal obstruction: Secondary | ICD-10-CM

## 2021-06-04 DIAGNOSIS — F419 Anxiety disorder, unspecified: Secondary | ICD-10-CM | POA: Diagnosis not present

## 2021-06-04 LAB — COMPREHENSIVE METABOLIC PANEL
ALT: 11 U/L (ref 0–53)
AST: 18 U/L (ref 0–37)
Albumin: 4.3 g/dL (ref 3.5–5.2)
Alkaline Phosphatase: 65 U/L (ref 39–117)
BUN: 11 mg/dL (ref 6–23)
CO2: 27 mEq/L (ref 19–32)
Calcium: 9.2 mg/dL (ref 8.4–10.5)
Chloride: 102 mEq/L (ref 96–112)
Creatinine, Ser: 1.39 mg/dL (ref 0.40–1.50)
GFR: 51.18 mL/min — ABNORMAL LOW (ref >60.00)
Glucose, Bld: 90 mg/dL (ref 70–99)
Potassium: 4.2 mEq/L (ref 3.5–5.1)
Sodium: 137 mEq/L (ref 135–145)
Total Bilirubin: 0.5 mg/dL (ref 0.2–1.2)
Total Protein: 7.4 g/dL (ref 6.0–8.3)

## 2021-06-04 LAB — CBC WITH DIFFERENTIAL/PLATELET
Basophils Absolute: 0.1 10*3/uL (ref 0.0–0.1)
Basophils Relative: 1.4 % (ref 0.0–3.0)
Eosinophils Absolute: 0.2 10*3/uL (ref 0.0–0.7)
Eosinophils Relative: 3.1 % (ref 0.0–5.0)
HCT: 44.1 % (ref 39.0–52.0)
Hemoglobin: 14.9 g/dL (ref 13.0–17.0)
Lymphocytes Relative: 28.1 % (ref 12.0–46.0)
Lymphs Abs: 2.2 10*3/uL (ref 0.7–4.0)
MCHC: 33.7 g/dL (ref 30.0–36.0)
MCV: 92.2 fl (ref 78.0–100.0)
Monocytes Absolute: 1 10*3/uL (ref 0.1–1.0)
Monocytes Relative: 12 % (ref 3.0–12.0)
Neutro Abs: 4.4 10*3/uL (ref 1.4–7.7)
Neutrophils Relative %: 55.4 % (ref 43.0–77.0)
Platelets: 191 10*3/uL (ref 150.0–400.0)
RBC: 4.78 Mil/uL (ref 4.22–5.81)
RDW: 13.5 % (ref 11.5–15.5)
WBC: 8 10*3/uL (ref 4.0–10.5)

## 2021-06-04 LAB — HEMOGLOBIN A1C: Hgb A1c MFr Bld: 5.5 % (ref 4.6–6.5)

## 2021-06-04 LAB — TSH: TSH: 2.98 u[IU]/mL (ref 0.35–5.50)

## 2021-06-04 MED ORDER — SCOPOLAMINE 1 MG/3DAYS TD PT72: 1.0000 | MEDICATED_PATCH | TRANSDERMAL | 1 refills | Status: DC

## 2021-06-04 NOTE — Assessment & Plan Note (Signed)
History of esophageal stricture Denies any reflux, dysphagia or nausea Continue omeprazole 20 mg daily Plans on making a follow-up with GI-wants to discuss if he needs to continue the omeprazole-advised discussing with GI and why he may need to continue it

## 2021-06-04 NOTE — Assessment & Plan Note (Signed)
Chronic Controlled, Stable Continue clonazepam 0.5 mg at bedtime

## 2021-06-04 NOTE — Assessment & Plan Note (Addendum)
Chronic Stable CMP, CBC, PTH

## 2021-06-04 NOTE — Assessment & Plan Note (Signed)
Chronic  Clinically euthyroid Currently taking levothyroxine 50 mcg daily Check tsh  Titrate med dose if needed  

## 2021-06-04 NOTE — Assessment & Plan Note (Signed)
Chronic Check a1c Low sugar / carb diet Stressed regular exercise  

## 2021-06-04 NOTE — Assessment & Plan Note (Signed)
Chronic Controlled, Stable Continue sertraline 100 mg daily, clonazepam 0.5 mg in the evening, 0.25 mg in the morning

## 2021-06-05 LAB — PARATHYROID HORMONE, INTACT (NO CA): PTH: 60 pg/mL (ref 16–77)

## 2021-06-13 DIAGNOSIS — S70361A Insect bite (nonvenomous), right thigh, initial encounter: Secondary | ICD-10-CM | POA: Diagnosis not present

## 2021-06-13 DIAGNOSIS — W57XXXA Bitten or stung by nonvenomous insect and other nonvenomous arthropods, initial encounter: Secondary | ICD-10-CM | POA: Diagnosis not present

## 2021-06-14 ENCOUNTER — Other Ambulatory Visit: Payer: Self-pay | Admitting: Internal Medicine

## 2021-06-15 ENCOUNTER — Other Ambulatory Visit: Payer: Self-pay | Admitting: Internal Medicine

## 2021-07-11 DIAGNOSIS — H2513 Age-related nuclear cataract, bilateral: Secondary | ICD-10-CM | POA: Diagnosis not present

## 2021-07-11 DIAGNOSIS — H52213 Irregular astigmatism, bilateral: Secondary | ICD-10-CM | POA: Diagnosis not present

## 2021-07-11 DIAGNOSIS — H18613 Keratoconus, stable, bilateral: Secondary | ICD-10-CM | POA: Diagnosis not present

## 2021-07-11 DIAGNOSIS — H43813 Vitreous degeneration, bilateral: Secondary | ICD-10-CM | POA: Diagnosis not present

## 2021-07-13 ENCOUNTER — Other Ambulatory Visit: Payer: Self-pay | Admitting: Internal Medicine

## 2021-07-16 DIAGNOSIS — H18603 Keratoconus, unspecified, bilateral: Secondary | ICD-10-CM | POA: Diagnosis not present

## 2021-07-25 ENCOUNTER — Other Ambulatory Visit: Payer: Self-pay | Admitting: Internal Medicine

## 2021-07-25 NOTE — Telephone Encounter (Signed)
Check [place] registry last filled 06/14/2021.Marland KitchenJohny Jones

## 2021-08-30 DIAGNOSIS — H43813 Vitreous degeneration, bilateral: Secondary | ICD-10-CM | POA: Diagnosis not present

## 2021-08-30 DIAGNOSIS — H18613 Keratoconus, stable, bilateral: Secondary | ICD-10-CM | POA: Diagnosis not present

## 2021-08-30 DIAGNOSIS — Z8669 Personal history of other diseases of the nervous system and sense organs: Secondary | ICD-10-CM | POA: Diagnosis not present

## 2021-09-13 ENCOUNTER — Other Ambulatory Visit: Payer: Self-pay | Admitting: Internal Medicine

## 2021-09-19 ENCOUNTER — Other Ambulatory Visit (HOSPITAL_BASED_OUTPATIENT_CLINIC_OR_DEPARTMENT_OTHER): Payer: Self-pay

## 2021-09-19 MED ORDER — INFLUENZA VAC A&B SA ADJ QUAD 0.5 ML IM PRSY
PREFILLED_SYRINGE | INTRAMUSCULAR | 0 refills | Status: DC
  Filled 2021-09-19: qty 0.5, 1d supply, fill #0

## 2021-09-20 ENCOUNTER — Other Ambulatory Visit (HOSPITAL_BASED_OUTPATIENT_CLINIC_OR_DEPARTMENT_OTHER): Payer: Self-pay

## 2021-10-08 ENCOUNTER — Encounter: Payer: Self-pay | Admitting: Internal Medicine

## 2021-10-09 MED ORDER — SCOPOLAMINE 1 MG/3DAYS TD PT72: 1.0000 | MEDICATED_PATCH | TRANSDERMAL | 12 refills | Status: DC

## 2021-12-02 ENCOUNTER — Encounter: Payer: Self-pay | Admitting: Internal Medicine

## 2021-12-02 NOTE — Progress Notes (Unsigned)
Subjective:    Patient ID: Caleb Jones [facility], male    DOB: Oct 10, 1950, 71 y.o.   MRN: [id number]     HPI Caleb Jones is here for follow up of his chronic medical problems, including hypothyroid, CKD3a, prediabetes, anxiety/depression, sleep d/o, esophageal stricture  Has had a couple of bloody noses.  Has started using humidifier.  Started a saline nasal spray.    No exercise.    Medications and allergies reviewed with patient and updated if appropriate.  Current Outpatient Medications on File Prior to Visit  Medication Sig Dispense Refill   acetaminophen (TYLENOL) 500 MG tablet Take 500 mg by mouth at bedtime as needed.     Cholecalciferol (VITAMIN D-1000 MAX ST) 25 MCG (1000 UT) tablet Take by mouth.     clonazePAM (KLONOPIN) 0.5 MG tablet TAKE 1/2 TABLET BY MOUTH IN THE MORNING AND 1 TABLET IN THE EVENING 60 tablet 5   Fish Oil-Cholecalciferol (OMEGA-3 + VITAMIN D3 PO) Take by mouth daily.     levothyroxine (SYNTHROID) 50 MCG tablet TAKE 1 TABLET BY MOUTH EVERY DAY 90 tablet 1   Multiple Vitamins-Minerals (MULTIVITAMIN ADULT PO) Take by mouth 1 day or 1 dose.     omeprazole (PRILOSEC) 20 MG capsule TAKE 1 CAPSULE BY MOUTH EVERY DAY; office visit for further refills 90 capsule 0   sertraline (ZOLOFT) 100 MG tablet TAKE 1 TABLET BY MOUTH EVERY DAY 90 tablet 0   No current facility-administered medications on file prior to visit.     Review of Systems  Constitutional:  Negative for fever.  Respiratory:  Negative for cough, shortness of breath and wheezing.   Cardiovascular:  Negative for chest pain, palpitations and leg swelling.  Neurological:  Positive for seizures and headaches (sinus headaches). Negative for light-headedness.       Objective:   Vitals:   12/03/21 1522  BP: 110/82  Pulse: 64  Temp: 98 F (36.7 C)  SpO2: 98%   BP Readings from Last 3 Encounters:  12/03/21 110/82  06/04/21 110/80  03/19/21 120/78   Wt Readings from Last 3 Encounters:   12/03/21 213 lb (96.6 kg)  06/04/21 213 lb (96.6 kg)  03/19/21 210 lb 9.6 oz (95.5 kg)   Body mass index is 28.3 kg/m.    Physical Exam Constitutional:      General: He is not in acute distress.    Appearance: Normal appearance. He is not ill-appearing.  HENT:     Head: Normocephalic and atraumatic.  Eyes:     Conjunctiva/sclera: Conjunctivae normal.  Cardiovascular:     Rate and Rhythm: Normal rate and regular rhythm.     Heart sounds: Normal heart sounds. No murmur heard. Pulmonary:     Effort: Pulmonary effort is normal. No respiratory distress.     Breath sounds: Normal breath sounds. No wheezing or rales.  Musculoskeletal:     Right lower leg: No edema.     Left lower leg: No edema.  Skin:    General: Skin is warm and dry.     Findings: No rash.  Neurological:     Mental Status: He is alert. Mental status is at baseline.  Psychiatric:        Mood and Affect: Mood normal.        Lab Results  Component Value Date   WBC 8.0 06/04/2021   HGB 14.9 06/04/2021   HCT 44.1 06/04/2021   PLT 191.0 06/04/2021   GLUCOSE 90 06/04/2021   CHOL 182  11/30/2019   TRIG 129.0 11/30/2019   HDL 47.20 11/30/2019   LDLCALC 109 (H) 11/30/2019   ALT 11 06/04/2021   AST 18 06/04/2021   NA 137 06/04/2021   K 4.2 06/04/2021   CL 102 06/04/2021   CREATININE 1.39 06/04/2021   BUN 11 06/04/2021   CO2 27 06/04/2021   TSH 2.98 06/04/2021   PSA 0.01 Repeated and verified X2. (L) 08/05/2010   HGBA1C 5.5 06/04/2021   MICROALBUR 4.4 (H) 05/30/2020     Assessment & Plan:    See Problem List for Assessment and Plan of chronic medical problems.

## 2021-12-02 NOTE — Patient Instructions (Addendum)
      Blood work was ordered.   The lab is on the first floor.    Medications changes include :   none     Return in about 6 months (around 06/03/2022) for Physical Exam.

## 2021-12-03 ENCOUNTER — Ambulatory Visit: Payer: BC Managed Care – PPO | Admitting: Internal Medicine

## 2021-12-03 VITALS — BP 110/82 | HR 64 | Temp 98.0°F | Ht 72.75 in | Wt 213.0 lb

## 2021-12-03 DIAGNOSIS — G479 Sleep disorder, unspecified: Secondary | ICD-10-CM

## 2021-12-03 DIAGNOSIS — E063 Autoimmune thyroiditis: Secondary | ICD-10-CM

## 2021-12-03 DIAGNOSIS — F419 Anxiety disorder, unspecified: Secondary | ICD-10-CM

## 2021-12-03 DIAGNOSIS — F329 Major depressive disorder, single episode, unspecified: Secondary | ICD-10-CM

## 2021-12-03 DIAGNOSIS — R7303 Prediabetes: Secondary | ICD-10-CM

## 2021-12-03 DIAGNOSIS — N1831 Chronic kidney disease, stage 3a: Secondary | ICD-10-CM | POA: Diagnosis not present

## 2021-12-03 DIAGNOSIS — K222 Esophageal obstruction: Secondary | ICD-10-CM

## 2021-12-03 LAB — COMPREHENSIVE METABOLIC PANEL
ALT: 14 U/L (ref 0–53)
AST: 23 U/L (ref 0–37)
Albumin: 4.3 g/dL (ref 3.5–5.2)
Alkaline Phosphatase: 66 U/L (ref 39–117)
BUN: 9 mg/dL (ref 6–23)
CO2: 29 mEq/L (ref 19–32)
Calcium: 8.9 mg/dL (ref 8.4–10.5)
Chloride: 102 mEq/L (ref 96–112)
Creatinine, Ser: 1.35 mg/dL (ref 0.40–1.50)
GFR: 52.82 mL/min — ABNORMAL LOW (ref >60.00)
Glucose, Bld: 116 mg/dL — ABNORMAL HIGH (ref 70–99)
Potassium: 4.3 mEq/L (ref 3.5–5.1)
Sodium: 138 mEq/L (ref 135–145)
Total Bilirubin: 0.4 mg/dL (ref 0.2–1.2)
Total Protein: 7.4 g/dL (ref 6.0–8.3)

## 2021-12-03 LAB — LIPID PANEL
Cholesterol: 170 mg/dL (ref 0–200)
HDL: 43.5 mg/dL (ref >39.00)
NonHDL: 126.69
Total CHOL/HDL Ratio: 4
Triglycerides: 205 mg/dL — ABNORMAL HIGH (ref 0.0–149.0)
VLDL: 41 mg/dL — ABNORMAL HIGH (ref 0.0–40.0)

## 2021-12-03 LAB — HEMOGLOBIN A1C: Hgb A1c MFr Bld: 5.8 % (ref 4.6–6.5)

## 2021-12-03 LAB — TSH: TSH: 1.53 u[IU]/mL (ref 0.35–5.50)

## 2021-12-03 LAB — LDL CHOLESTEROL, DIRECT: Direct LDL: 113 mg/dL

## 2021-12-03 NOTE — Assessment & Plan Note (Signed)
Chronic Controlled, Stable Continue sertraline 100 mg daily, clonazepam 0.25 mg in the morning, 0.5 mg in the evening

## 2021-12-03 NOTE — Assessment & Plan Note (Signed)
Chronic Check a1c Low sugar / carb diet Stressed regular exercise  

## 2021-12-03 NOTE — Assessment & Plan Note (Signed)
Chronic  Clinically euthyroid Check tsh and will titrate med dose if needed Currently taking levothyroxine 50 mcg daily 

## 2021-12-03 NOTE — Assessment & Plan Note (Signed)
Chronic Blood pressure well-controlled Avoiding NSAIDs Following with nephrology CBC, CMP

## 2021-12-03 NOTE — Assessment & Plan Note (Signed)
Chronic Controlled, Stable Continue clonazepam 0.5 mg at bedtime for stress and to help with sleep

## 2021-12-03 NOTE — Assessment & Plan Note (Signed)
History of esophageal stricture Denies any reflux, dysphagia Continue omeprazole 20 mg daily

## 2021-12-03 NOTE — Assessment & Plan Note (Signed)
Chronic Controlled, Stable Continue sertraline 100 mg daily 

## 2021-12-04 LAB — CBC WITH DIFFERENTIAL/PLATELET
Basophils Absolute: 0.1 10*3/uL (ref 0.0–0.1)
Basophils Relative: 1.1 % (ref 0.0–3.0)
Eosinophils Absolute: 0.2 10*3/uL (ref 0.0–0.7)
Eosinophils Relative: 2.4 % (ref 0.0–5.0)
HCT: 43.3 % (ref 39.0–52.0)
Hemoglobin: 14.7 g/dL (ref 13.0–17.0)
Lymphocytes Relative: 25.6 % (ref 12.0–46.0)
Lymphs Abs: 2.4 10*3/uL (ref 0.7–4.0)
MCHC: 33.9 g/dL (ref 30.0–36.0)
MCV: 93.4 fl (ref 78.0–100.0)
Monocytes Absolute: 1 10*3/uL (ref 0.1–1.0)
Monocytes Relative: 10.4 % (ref 3.0–12.0)
Neutro Abs: 5.8 10*3/uL (ref 1.4–7.7)
Neutrophils Relative %: 60.5 % (ref 43.0–77.0)
Platelets: 194 10*3/uL (ref 150.0–400.0)
RBC: 4.63 Mil/uL (ref 4.22–5.81)
RDW: 12.6 % (ref 11.5–15.5)
WBC: 9.5 10*3/uL (ref 4.0–10.5)

## 2021-12-12 ENCOUNTER — Other Ambulatory Visit: Payer: Self-pay | Admitting: Internal Medicine

## 2022-01-05 ENCOUNTER — Other Ambulatory Visit: Payer: Self-pay | Admitting: Internal Medicine

## 2022-01-16 DIAGNOSIS — H18603 Keratoconus, unspecified, bilateral: Secondary | ICD-10-CM | POA: Diagnosis not present

## 2022-01-16 DIAGNOSIS — H43813 Vitreous degeneration, bilateral: Secondary | ICD-10-CM | POA: Diagnosis not present

## 2022-02-11 ENCOUNTER — Other Ambulatory Visit: Payer: Self-pay | Admitting: Internal Medicine

## 2022-03-09 ENCOUNTER — Other Ambulatory Visit: Payer: Self-pay | Admitting: Internal Medicine

## 2022-03-19 ENCOUNTER — Encounter: Payer: Self-pay | Admitting: Internal Medicine

## 2022-03-20 MED ORDER — CLONAZEPAM 0.5 MG PO TABS: 0.5000 mg | ORAL_TABLET | Freq: Three times a day (TID) | ORAL | 0 refills | Status: DC | PRN

## 2022-06-05 ENCOUNTER — Other Ambulatory Visit: Payer: Self-pay | Admitting: Internal Medicine

## 2022-06-11 DIAGNOSIS — E663 Overweight: Secondary | ICD-10-CM | POA: Diagnosis not present

## 2022-06-11 DIAGNOSIS — N1831 Chronic kidney disease, stage 3a: Secondary | ICD-10-CM | POA: Diagnosis not present

## 2022-06-11 DIAGNOSIS — E785 Hyperlipidemia, unspecified: Secondary | ICD-10-CM | POA: Diagnosis not present

## 2022-06-11 DIAGNOSIS — R310 Gross hematuria: Secondary | ICD-10-CM | POA: Diagnosis not present

## 2022-06-12 LAB — LAB REPORT - SCANNED
Creatinine, POC: 103.8 mg/dL
EGFR: 57

## 2022-06-17 ENCOUNTER — Encounter: Payer: Self-pay | Admitting: Internal Medicine

## 2022-07-06 ENCOUNTER — Other Ambulatory Visit: Payer: Self-pay | Admitting: Internal Medicine

## 2022-07-29 ENCOUNTER — Other Ambulatory Visit: Payer: Self-pay | Admitting: Internal Medicine

## 2022-08-06 ENCOUNTER — Encounter (INDEPENDENT_AMBULATORY_CARE_PROVIDER_SITE_OTHER): Payer: Self-pay

## 2022-08-26 ENCOUNTER — Encounter: Payer: Self-pay | Admitting: Internal Medicine

## 2022-08-26 NOTE — Progress Notes (Unsigned)
Subjective:    Patient ID: Caleb Jones [facility], male    DOB: [birth year], 72 y.o.   MRN: [id number]     HPI Caleb Jones is here for a physical exam and his chronic medical problems.   Doing well.  No concerns.   Medications and allergies reviewed with patient and updated if appropriate.  Current Outpatient Medications on File Prior to Visit  Medication Sig Dispense Refill   acetaminophen (TYLENOL) 500 MG tablet Take 500 mg by mouth at bedtime as needed.     Cholecalciferol (VITAMIN D-1000 MAX ST) 25 MCG (1000 UT) tablet Take by mouth.     clonazePAM (KLONOPIN) 0.5 MG tablet Take 1 tablet (0.5 mg total) by mouth 3 (three) times daily as needed for anxiety. 90 tablet 0   Fish Oil-Cholecalciferol (OMEGA-3 + VITAMIN D3 PO) Take by mouth daily.     levothyroxine (SYNTHROID) 50 MCG tablet TAKE 1 TABLET BY MOUTH EVERY DAY 90 tablet 1   Multiple Vitamins-Minerals (MULTIVITAMIN ADULT PO) Take by mouth 1 day or 1 dose.     omeprazole (PRILOSEC) 20 MG capsule TAKE 1 CAPSULE BY MOUTH EVERY DAY; office visit for further refills 90 capsule 0   sertraline (ZOLOFT) 100 MG tablet TAKE 1 TABLET BY MOUTH EVERY DAY 90 tablet 0   No current facility-administered medications on file prior to visit.    Review of Systems  Constitutional:  Negative for fever.  HENT:  Positive for trouble swallowing (mild).   Eyes:  Negative for visual disturbance.  Respiratory:  Negative for cough, shortness of breath and wheezing.   Cardiovascular:  Positive for palpitations (with anxiety). Negative for chest pain and leg swelling.  Gastrointestinal:  Negative for abdominal pain, blood in stool, constipation and diarrhea.       No gerd  Genitourinary:  Negative for difficulty urinating.  Musculoskeletal:  Negative for arthralgias and back pain.  Skin:  Negative for rash.  Neurological:  Negative for light-headedness and headaches.       Some balance issues  Psychiatric/Behavioral:  Positive for dysphoric mood. The  patient is nervous/anxious.        Objective:   Vitals:   08/27/22 0830  BP: 108/74  Pulse: 77  Temp: 98.5 F (36.9 C)  SpO2: 97%   Filed Weights   08/27/22 0830  Weight: 218 lb (98.9 kg)   Body mass index is 28.96 kg/m.  BP Readings from Last 3 Encounters:  08/27/22 108/74  12/03/21 110/82  06/04/21 110/80    Wt Readings from Last 3 Encounters:  08/27/22 218 lb (98.9 kg)  12/03/21 213 lb (96.6 kg)  06/04/21 213 lb (96.6 kg)      Physical Exam Constitutional: He appears well-developed and well-nourished. No distress.  HENT:  Head: Normocephalic and atraumatic.  Right Ear: External ear normal.  Left Ear: External ear normal.  Normal ear canals and TM b/l  Mouth/Throat: Oropharynx is clear and moist. Eyes: Conjunctivae and EOM are normal.  Neck: Neck supple. No tracheal deviation present. No thyromegaly present.  No carotid bruit  Cardiovascular: Normal rate, regular rhythm, normal heart sounds and intact distal pulses.   No murmur heard.  No lower extremity edema. Pulmonary/Chest: Effort normal and breath sounds normal. No respiratory distress. He has no wheezes. He has no rales.  Abdominal: Soft. He exhibits no distension. There is no tenderness.  Genitourinary: deferred  Lymphadenopathy:   He has no cervical adenopathy.  Skin: Skin is warm and dry. He is not diaphoretic.  Psychiatric: He has a normal mood and affect. His behavior is normal.         Assessment & Plan:   Physical exam: Screening blood work  ordered Exercise   none - will start regular exercise Weight knows he needs to lose weight Substance abuse   none   Reviewed recommended immunizations.   Health Maintenance  Topic Date Due   Zoster Vaccines- Shingrix (1 of 2) 05/31/2000   Pneumonia Vaccine 15+ Years old (3 of 3 - PPSV23 or PCV20) 04/27/2019   COVID-19 Vaccine (6 - 2023-24 season) 11/28/2021   INFLUENZA VACCINE  04/06/2023 (Originally 08/07/2022)   DTaP/Tdap/Td (3 - Tdap)  08/14/2024   Colonoscopy  05/23/2025   Hepatitis C Screening  Completed   HPV VACCINES  Aged Out     See Problem List for Assessment and Plan of chronic medical problems.

## 2022-08-26 NOTE — Patient Instructions (Addendum)
Blood work was ordered.   The lab is on the first floor.    Medications changes include :  none     Return in about 6 months (around 02/27/2023) for follow up.    Health Maintenance, Male Adopting a healthy lifestyle and getting preventive care are important in promoting health and wellness. Ask your health care provider about: The right schedule for you to have regular tests and exams. Things you can do on your own to prevent diseases and keep yourself healthy. What should I know about diet, weight, and exercise? Eat a healthy diet  Eat a diet that includes plenty of vegetables, fruits, low-fat dairy products, and lean protein. Do not eat a lot of foods that are high in solid fats, added sugars, or sodium. Maintain a healthy weight Body mass index (BMI) is a measurement that can be used to identify possible weight problems. It estimates body fat based on height and weight. Your health care provider can help determine your BMI and help you achieve or maintain a healthy weight. Get regular exercise Get regular exercise. This is one of the most important things you can do for your health. Most adults should: Exercise for at least 150 minutes each week. The exercise should increase your heart rate and make you sweat (moderate-intensity exercise). Do strengthening exercises at least twice a week. This is in addition to the moderate-intensity exercise. Spend less time sitting. Even light physical activity can be beneficial. Watch cholesterol and blood lipids Have your blood tested for lipids and cholesterol at 72 years of age, then have this test every 5 years. You may need to have your cholesterol levels checked more often if: Your lipid or cholesterol levels are high. You are older than 72 years of age. You are at high risk for heart disease. What should I know about cancer screening? Many types of cancers can be detected early and may often be prevented. Depending on your  health history and family history, you may need to have cancer screening at various ages. This may include screening for: Colorectal cancer. Prostate cancer. Skin cancer. Lung cancer. What should I know about heart disease, diabetes, and high blood pressure? Blood pressure and heart disease High blood pressure causes heart disease and increases the risk of stroke. This is more likely to develop in people who have high blood pressure readings or are overweight. Talk with your health care provider about your target blood pressure readings. Have your blood pressure checked: Every 3-5 years if you are 32-60 years of age. Every year if you are 75 years old or older. If you are between the ages of 65 and 56 and are a current or former smoker, ask your health care provider if you should have a one-time screening for abdominal aortic aneurysm (AAA). Diabetes Have regular diabetes screenings. This checks your fasting blood sugar level. Have the screening done: Once every three years after age 51 if you are at a normal weight and have a low risk for diabetes. More often and at a younger age if you are overweight or have a high risk for diabetes. What should I know about preventing infection? Hepatitis B If you have a higher risk for hepatitis B, you should be screened for this virus. Talk with your health care provider to find out if you are at risk for hepatitis B infection. Hepatitis C Blood testing is recommended for: Everyone born from [birth year] through 1965. Anyone with known risk  factors for hepatitis C. Sexually transmitted infections (STIs) You should be screened each year for STIs, including gonorrhea and chlamydia, if: You are sexually active and are younger than 72 years of age. You are older than 72 years of age and your health care provider tells you that you are at risk for this type of infection. Your sexual activity has changed since you were last screened, and you are at increased risk  for chlamydia or gonorrhea. Ask your health care provider if you are at risk. Ask your health care provider about whether you are at high risk for HIV. Your health care provider may recommend a prescription medicine to help prevent HIV infection. If you choose to take medicine to prevent HIV, you should first get tested for HIV. You should then be tested every 3 months for as long as you are taking the medicine. Follow these instructions at home: Alcohol use Do not drink alcohol if your health care provider tells you not to drink. If you drink alcohol: Limit how much you have to 0-2 drinks a day. Know how much alcohol is in your drink. In the [place] one drink equals one 12 oz bottle of beer (355 mL), one 5 oz glass of wine (148 mL), or one 1 oz glass of hard liquor (44 mL). Lifestyle Do not use any products that contain nicotine or tobacco. These products include cigarettes, chewing tobacco, and vaping devices, such as e-cigarettes. If you need help quitting, ask your health care provider. Do not use street drugs. Do not share needles. Ask your health care provider for help if you need support or information about quitting drugs. General instructions Schedule regular health, dental, and eye exams. Stay current with your vaccines. Tell your health care provider if: You often feel depressed. You have ever been abused or do not feel safe at home. Summary Adopting a healthy lifestyle and getting preventive care are important in promoting health and wellness. Follow your health care provider's instructions about healthy diet, exercising, and getting tested or screened for diseases. Follow your health care provider's instructions on monitoring your cholesterol and blood pressure. This information is not intended to replace advice given to you by your health care provider. Make sure you discuss any questions you have with your health care provider. Document Revised: 05/14/2020 Document Reviewed:  05/14/2020 Elsevier Patient Education  2024 [employer].

## 2022-08-27 ENCOUNTER — Ambulatory Visit: Payer: BC Managed Care – PPO | Admitting: Internal Medicine

## 2022-08-27 VITALS — BP 108/74 | HR 77 | Temp 98.5°F | Ht 72.75 in | Wt 218.0 lb

## 2022-08-27 DIAGNOSIS — F419 Anxiety disorder, unspecified: Secondary | ICD-10-CM | POA: Diagnosis not present

## 2022-08-27 DIAGNOSIS — E063 Autoimmune thyroiditis: Secondary | ICD-10-CM

## 2022-08-27 DIAGNOSIS — F329 Major depressive disorder, single episode, unspecified: Secondary | ICD-10-CM

## 2022-08-27 DIAGNOSIS — N1831 Chronic kidney disease, stage 3a: Secondary | ICD-10-CM | POA: Diagnosis not present

## 2022-08-27 DIAGNOSIS — R7303 Prediabetes: Secondary | ICD-10-CM | POA: Diagnosis not present

## 2022-08-27 DIAGNOSIS — G479 Sleep disorder, unspecified: Secondary | ICD-10-CM

## 2022-08-27 DIAGNOSIS — R2689 Other abnormalities of gait and mobility: Secondary | ICD-10-CM

## 2022-08-27 DIAGNOSIS — K222 Esophageal obstruction: Secondary | ICD-10-CM

## 2022-08-27 DIAGNOSIS — Z0000 Encounter for general adult medical examination without abnormal findings: Secondary | ICD-10-CM | POA: Diagnosis not present

## 2022-08-27 LAB — COMPREHENSIVE METABOLIC PANEL
ALT: 13 U/L (ref 0–53)
AST: 20 U/L (ref 0–37)
Albumin: 4.2 g/dL (ref 3.5–5.2)
Alkaline Phosphatase: 64 U/L (ref 39–117)
BUN: 16 mg/dL (ref 6–23)
CO2: 28 meq/L (ref 19–32)
Calcium: 9.4 mg/dL (ref 8.4–10.5)
Chloride: 103 meq/L (ref 96–112)
Creatinine, Ser: 1.33 mg/dL (ref 0.40–1.50)
GFR: 53.5 mL/min — ABNORMAL LOW (ref >60.00)
Glucose, Bld: 82 mg/dL (ref 70–99)
Potassium: 4.1 mEq/L (ref 3.5–5.1)
Sodium: 137 meq/L (ref 135–145)
Total Bilirubin: 0.6 mg/dL (ref 0.2–1.2)
Total Protein: 7.4 g/dL (ref 6.0–8.3)

## 2022-08-27 LAB — LIPID PANEL
Cholesterol: 185 mg/dL (ref 0–200)
HDL: 38.7 mg/dL — ABNORMAL LOW (ref >39.00)
LDL Cholesterol: 111 mg/dL — ABNORMAL HIGH (ref 0–99)
NonHDL: 146.03
Total CHOL/HDL Ratio: 5
Triglycerides: 173 mg/dL — ABNORMAL HIGH (ref 0.0–149.0)
VLDL: 34.6 mg/dL (ref 0.0–40.0)

## 2022-08-27 LAB — CBC WITH DIFFERENTIAL/PLATELET
Basophils Absolute: 0.1 10*3/uL (ref 0.0–0.1)
Basophils Relative: 1.3 % (ref 0.0–3.0)
Eosinophils Absolute: 0.2 10*3/uL (ref 0.0–0.7)
Eosinophils Relative: 2.7 % (ref 0.0–5.0)
HCT: 46 % (ref 39.0–52.0)
Hemoglobin: 14.8 g/dL (ref 13.0–17.0)
Lymphocytes Relative: 23.3 % (ref 12.0–46.0)
Lymphs Abs: 1.9 10*3/uL (ref 0.7–4.0)
MCHC: 32.1 g/dL (ref 30.0–36.0)
MCV: 95.1 fl (ref 78.0–100.0)
Monocytes Absolute: 1.1 10*3/uL — ABNORMAL HIGH (ref 0.1–1.0)
Monocytes Relative: 12.9 % — ABNORMAL HIGH (ref 3.0–12.0)
Neutro Abs: 4.9 10*3/uL (ref 1.4–7.7)
Neutrophils Relative %: 59.8 % (ref 43.0–77.0)
Platelets: 201 10*3/uL (ref 150.0–400.0)
RBC: 4.84 Mil/uL (ref 4.22–5.81)
RDW: 13.3 % (ref 11.5–15.5)
WBC: 8.2 10*3/uL (ref 4.0–10.5)

## 2022-08-27 LAB — HEMOGLOBIN A1C: Hgb A1c MFr Bld: 5.7 % (ref 4.6–6.5)

## 2022-08-27 NOTE — Assessment & Plan Note (Signed)
Chronic Discussed in 2021 - again notices his balance is not as good - no obvious cause Not exercising -- stressed starting back with regular exercise ? Vision related - very possible Consider balance exercises Will monitor

## 2022-08-27 NOTE — Assessment & Plan Note (Signed)
Chronic Controlled, Stable Continue sertraline 100 mg daily

## 2022-08-27 NOTE — Assessment & Plan Note (Signed)
Chronic Check a1c Low sugar / carb diet Stressed regular exercise  

## 2022-08-27 NOTE — Assessment & Plan Note (Signed)
Chronic Controlled, Stable Continue clonazepam 0.5 mg at bedtime for stress and to help with sleep

## 2022-08-27 NOTE — Assessment & Plan Note (Signed)
Chronic Blood pressure well-controlled Avoiding NSAIDs Following with nephrology CBC, CMP

## 2022-08-27 NOTE — Assessment & Plan Note (Addendum)
History of esophageal stricture Denies any reflux - never had it Has noticed a little more difficulty swallowing - knows to contact GI as this gets worse Continue omeprazole 20 mg daily

## 2022-08-27 NOTE — Assessment & Plan Note (Signed)
Chronic Controlled, Stable Continue sertraline 100 mg daily, clonazepam 0.25 mg in the morning, 0.5 mg in the evening

## 2022-08-27 NOTE — Assessment & Plan Note (Signed)
Chronic  Clinically euthyroid Check tsh and will titrate med dose if needed Currently taking levothyroxine 50 mcg daily 

## 2022-08-29 LAB — TSH: TSH: 2.79 u[IU]/mL (ref 0.35–5.50)

## 2022-08-31 ENCOUNTER — Other Ambulatory Visit: Payer: Self-pay | Admitting: Internal Medicine

## 2022-09-22 ENCOUNTER — Other Ambulatory Visit: Payer: Self-pay | Admitting: Internal Medicine

## 2022-10-29 ENCOUNTER — Encounter: Payer: Self-pay | Admitting: Internal Medicine

## 2022-10-30 MED ORDER — CLONAZEPAM 0.5 MG PO TABS: 0.5000 mg | ORAL_TABLET | Freq: Two times a day (BID) | ORAL | 5 refills | Status: DC | PRN

## 2022-10-31 ENCOUNTER — Telehealth: Payer: Self-pay | Admitting: Internal Medicine

## 2022-10-31 NOTE — Telephone Encounter (Signed)
PT SENT THIS MSG VIA MYCHART AND WANT ME TO SEND THIS DIRECTLY TO HIS DR AND NURSE LAST TIME PT WAS SEEN WAS 8.21.24 PLEASE ADVISE.Caleb Jones   "Hi Dr Lawerance Bach, Please refill my Clonazepam prescription. The last time it was refilled for only 45 pills for 30 days; For convenience I need to go back to the normal prescription for 60 pills. I am all but out of pills." Thanks very much,  [employer]

## 2022-10-31 NOTE — Telephone Encounter (Signed)
Spoke with patient today. 

## 2022-11-18 ENCOUNTER — Other Ambulatory Visit: Payer: Self-pay | Admitting: Internal Medicine

## 2022-11-19 ENCOUNTER — Other Ambulatory Visit: Payer: Self-pay | Admitting: Internal Medicine

## 2022-11-29 ENCOUNTER — Other Ambulatory Visit: Payer: Self-pay | Admitting: Internal Medicine

## 2022-12-16 DIAGNOSIS — H52213 Irregular astigmatism, bilateral: Secondary | ICD-10-CM | POA: Diagnosis not present

## 2022-12-16 DIAGNOSIS — H18603 Keratoconus, unspecified, bilateral: Secondary | ICD-10-CM | POA: Diagnosis not present

## 2022-12-16 DIAGNOSIS — H2513 Age-related nuclear cataract, bilateral: Secondary | ICD-10-CM | POA: Diagnosis not present

## 2022-12-16 DIAGNOSIS — H43813 Vitreous degeneration, bilateral: Secondary | ICD-10-CM | POA: Diagnosis not present

## 2022-12-16 DIAGNOSIS — H04123 Dry eye syndrome of bilateral lacrimal glands: Secondary | ICD-10-CM | POA: Diagnosis not present

## 2023-01-09 ENCOUNTER — Encounter: Payer: Self-pay | Admitting: Internal Medicine

## 2023-01-12 MED ORDER — SCOPOLAMINE 1 MG/3DAYS TD PT72: 1.0000 | MEDICATED_PATCH | TRANSDERMAL | 0 refills | Status: DC

## 2023-03-05 ENCOUNTER — Other Ambulatory Visit: Payer: Self-pay | Admitting: Internal Medicine

## 2023-03-22 ENCOUNTER — Encounter: Payer: Self-pay | Admitting: Internal Medicine

## 2023-03-31 ENCOUNTER — Other Ambulatory Visit: Payer: Self-pay | Admitting: Internal Medicine

## 2023-03-31 DIAGNOSIS — H18603 Keratoconus, unspecified, bilateral: Secondary | ICD-10-CM | POA: Diagnosis not present

## 2023-04-02 ENCOUNTER — Encounter: Payer: Self-pay | Admitting: Internal Medicine

## 2023-04-02 NOTE — Patient Instructions (Addendum)
      Blood work was ordered.       Medications changes include :   None    A referral was ordered and someone will call you to schedule an appointment.     Return in about 6 months (around 10/04/2023) for Physical Exam.

## 2023-04-02 NOTE — Progress Notes (Unsigned)
 Subjective:    Patient ID: Caleb Jones [facility], male    DOB: March 05, 1950, 73 y.o.   MRN: [id number]     HPI Quavis is here for follow up of his chronic medical problems.    Was taking pravastatin - had joint pain but may have been taking it twice a day because he could not remember if he had taken it.    Not exercising regularly.  Not eating as well - anxiety level is up so binge eating.  Has stopped eating ice cream for the past couple of weeks.   Medications and allergies reviewed with patient and updated if appropriate.  Current Outpatient Medications on File Prior to Visit  Medication Sig Dispense Refill   acetaminophen (TYLENOL) 500 MG tablet Take 500 mg by mouth at bedtime as needed.     Cholecalciferol (VITAMIN D-1000 MAX ST) 25 MCG (1000 UT) tablet Take by mouth.     clonazePAM (KLONOPIN) 0.5 MG tablet Take 1 tablet (0.5 mg total) by mouth 2 (two) times daily as needed for anxiety. 60 tablet 5   levothyroxine (LEVOXYL) 50 MCG tablet Take 1 tablet (50 mcg total) by mouth daily before breakfast. 90 tablet 1   Multiple Vitamins-Minerals (MULTIVITAMIN ADULT PO) Take by mouth 1 day or 1 dose.     omeprazole (PRILOSEC) 20 MG capsule TAKE 1 CAPSULE BY MOUTH EVERY DAY; office visit for further refills 90 capsule 0   sertraline (ZOLOFT) 100 MG tablet TAKE 1 TABLET BY MOUTH EVERY DAY. Please schedule appointment for further refills. 30 tablet 0   pravastatin (PRAVACHOL) 20 MG tablet Take 20 mg by mouth daily. (Patient not taking: Reported on 04/03/2023)     No current facility-administered medications on file prior to visit.     Review of Systems  Constitutional:  Negative for fever.  Respiratory:  Negative for cough, shortness of breath and wheezing.   Cardiovascular:  Positive for palpitations (anxiety). Negative for chest pain and leg swelling.  Endocrine: Positive for polydipsia. Negative for polyuria.  Neurological:  Positive for light-headedness and headaches.        Objective:   Vitals:   04/03/23 0918  BP: (!) 140/90  Pulse: 68  Temp: 97.8 F (36.6 C)  SpO2: 95%   BP Readings from Last 3 Encounters:  04/03/23 (!) 140/90  08/27/22 108/74  12/03/21 110/82   Wt Readings from Last 3 Encounters:  04/03/23 220 lb (99.8 kg)  08/27/22 218 lb (98.9 kg)  12/03/21 213 lb (96.6 kg)   Body mass index is 29.23 kg/m.    Physical Exam Constitutional:      General: He is not in acute distress.    Appearance: Normal appearance. He is not ill-appearing.  HENT:     Head: Normocephalic and atraumatic.  Eyes:     Conjunctiva/sclera: Conjunctivae normal.  Cardiovascular:     Rate and Rhythm: Normal rate and regular rhythm.     Heart sounds: Normal heart sounds.  Pulmonary:     Effort: Pulmonary effort is normal. No respiratory distress.     Breath sounds: Normal breath sounds. No wheezing or rales.  Musculoskeletal:     Right lower leg: No edema.     Left lower leg: No edema.  Skin:    General: Skin is warm and dry.     Findings: No rash.  Neurological:     Mental Status: He is alert. Mental status is at baseline.  Psychiatric:  Mood and Affect: Mood normal.        Lab Results  Component Value Date   WBC 8.2 08/27/2022   HGB 14.8 08/27/2022   HCT 46.0 08/27/2022   PLT 201.0 08/27/2022   GLUCOSE 82 08/27/2022   CHOL 185 08/27/2022   TRIG 173.0 (H) 08/27/2022   HDL 38.70 (L) 08/27/2022   LDLDIRECT 113.0 12/03/2021   LDLCALC 111 (H) 08/27/2022   ALT 13 08/27/2022   AST 20 08/27/2022   NA 137 08/27/2022   K 4.1 08/27/2022   CL 103 08/27/2022   CREATININE 1.33 08/27/2022   BUN 16 08/27/2022   CO2 28 08/27/2022   TSH 2.79 08/27/2022   PSA 0.01 Repeated and verified X2. (L) 08/05/2010   HGBA1C 5.7 08/27/2022   MICROALBUR 4.4 (H) 05/30/2020     Assessment & Plan:    See Problem List for Assessment and Plan of chronic medical problems.

## 2023-04-03 ENCOUNTER — Encounter: Payer: Self-pay | Admitting: Internal Medicine

## 2023-04-03 ENCOUNTER — Ambulatory Visit: Admitting: Internal Medicine

## 2023-04-03 VITALS — BP 120/86 | HR 68 | Temp 97.8°F | Ht 72.75 in | Wt 220.0 lb

## 2023-04-03 DIAGNOSIS — R7303 Prediabetes: Secondary | ICD-10-CM | POA: Diagnosis not present

## 2023-04-03 DIAGNOSIS — E063 Autoimmune thyroiditis: Secondary | ICD-10-CM

## 2023-04-03 DIAGNOSIS — N1831 Chronic kidney disease, stage 3a: Secondary | ICD-10-CM

## 2023-04-03 DIAGNOSIS — Z136 Encounter for screening for cardiovascular disorders: Secondary | ICD-10-CM | POA: Insufficient documentation

## 2023-04-03 DIAGNOSIS — F329 Major depressive disorder, single episode, unspecified: Secondary | ICD-10-CM | POA: Diagnosis not present

## 2023-04-03 DIAGNOSIS — F419 Anxiety disorder, unspecified: Secondary | ICD-10-CM | POA: Diagnosis not present

## 2023-04-03 LAB — COMPREHENSIVE METABOLIC PANEL WITH GFR
ALT: 14 U/L (ref 0–53)
AST: 19 U/L (ref 0–37)
Albumin: 4.1 g/dL (ref 3.5–5.2)
Alkaline Phosphatase: 63 U/L (ref 39–117)
BUN: 11 mg/dL (ref 6–23)
CO2: 25 meq/L (ref 19–32)
Calcium: 8.9 mg/dL (ref 8.4–10.5)
Chloride: 111 meq/L (ref 96–112)
Creatinine, Ser: 1.43 mg/dL (ref 0.40–1.50)
GFR: 48.84 mL/min — ABNORMAL LOW (ref >60.00)
Glucose, Bld: 75 mg/dL (ref 70–99)
Potassium: 4.7 meq/L (ref 3.5–5.1)
Sodium: 142 meq/L (ref 135–145)
Total Bilirubin: 0.3 mg/dL (ref 0.2–1.2)
Total Protein: 7.1 g/dL (ref 6.0–8.3)

## 2023-04-03 LAB — TSH: TSH: 3.16 u[IU]/mL (ref 0.35–5.50)

## 2023-04-03 LAB — MICROALBUMIN / CREATININE URINE RATIO
Creatinine,U: 228.9 mg/dL
Microalb Creat Ratio: 133 mg/g — ABNORMAL HIGH (ref 0.0–30.0)
Microalb, Ur: 30.4 mg/dL — ABNORMAL HIGH (ref 0.0–1.9)

## 2023-04-03 MED ORDER — LEVOTHYROXINE SODIUM 50 MCG PO TABS: 50.0000 ug | ORAL_TABLET | Freq: Every day | ORAL | 1 refills | Status: DC

## 2023-04-03 MED ORDER — SERTRALINE HCL 100 MG PO TABS: ORAL_TABLET | ORAL | 1 refills | Status: DC

## 2023-04-03 MED ORDER — CLONAZEPAM 0.5 MG PO TABS: 0.5000 mg | ORAL_TABLET | Freq: Two times a day (BID) | ORAL | 1 refills | Status: DC | PRN

## 2023-04-03 NOTE — Assessment & Plan Note (Addendum)
 Chronic Blood pressure well-controlled Avoiding NSAIDs Following with nephrology CBC, CMP, urine microalbumin

## 2023-04-03 NOTE — Assessment & Plan Note (Signed)
 Chronic  Clinically euthyroid Check tsh and will titrate med dose if needed Currently taking levothyroxine 50 mcg daily

## 2023-04-03 NOTE — Assessment & Plan Note (Signed)
 Will get Ct cac Will restart pravastatin and see if he tolerates it - will let me know if he does not tolerate it

## 2023-04-03 NOTE — Assessment & Plan Note (Addendum)
 Chronic Fairly controlled-slightly increased situationally Continue sertraline 100 mg daily, clonazepam 0.5 mg in the morning, 0.5 mg in the evening

## 2023-04-03 NOTE — Assessment & Plan Note (Signed)
 Chronic Lab Results  Component Value Date   HGBA1C 5.7 08/27/2022   Check a1c Low sugar / carb diet Stressed regular exercise

## 2023-04-03 NOTE — Assessment & Plan Note (Signed)
 Chronic Controlled, Stable Continue sertraline 100 mg daily

## 2023-04-04 LAB — HEMOGLOBIN A1C: Hgb A1c MFr Bld: 5.7 % (ref 4.6–6.5)

## 2023-04-05 ENCOUNTER — Encounter: Payer: Self-pay | Admitting: Internal Medicine

## 2023-04-09 ENCOUNTER — Encounter: Payer: Self-pay | Admitting: Internal Medicine

## 2023-04-09 ENCOUNTER — Ambulatory Visit (HOSPITAL_COMMUNITY)
Admission: RE | Admit: 2023-04-09 | Discharge: 2023-04-09 | Disposition: A | Discharge status: Home / Self Care | Source: Ambulatory Visit | Attending: Internal Medicine | Admitting: Internal Medicine

## 2023-04-09 DIAGNOSIS — Z136 Encounter for screening for cardiovascular disorders: Secondary | ICD-10-CM | POA: Insufficient documentation

## 2023-05-31 ENCOUNTER — Other Ambulatory Visit: Payer: Self-pay | Admitting: Internal Medicine

## 2023-06-12 DIAGNOSIS — N1831 Chronic kidney disease, stage 3a: Secondary | ICD-10-CM | POA: Diagnosis not present

## 2023-06-12 DIAGNOSIS — E785 Hyperlipidemia, unspecified: Secondary | ICD-10-CM | POA: Diagnosis not present

## 2023-06-12 DIAGNOSIS — R310 Gross hematuria: Secondary | ICD-10-CM | POA: Diagnosis not present

## 2023-06-12 DIAGNOSIS — Z8546 Personal history of malignant neoplasm of prostate: Secondary | ICD-10-CM | POA: Diagnosis not present

## 2023-06-29 DIAGNOSIS — H15091 Other scleritis, right eye: Secondary | ICD-10-CM | POA: Diagnosis not present

## 2023-07-06 DIAGNOSIS — H15091 Other scleritis, right eye: Secondary | ICD-10-CM | POA: Diagnosis not present

## 2023-08-07 ENCOUNTER — Encounter: Payer: Self-pay | Admitting: Family Medicine

## 2023-08-07 ENCOUNTER — Ambulatory Visit: Admitting: Family Medicine

## 2023-08-07 VITALS — BP 140/80 | HR 67 | Temp 97.9°F | Ht 72.75 in | Wt 223.2 lb

## 2023-08-07 DIAGNOSIS — M25422 Effusion, left elbow: Secondary | ICD-10-CM | POA: Diagnosis not present

## 2023-08-07 NOTE — Progress Notes (Signed)
   Acute Office Visit  Subjective:     Patient ID: Caleb Jones, male    DOB: 04/01/1950, 73 y.o.   MRN: [id number]  Chief Complaint  Patient presents with   Elbow Injury    HPI Patient here for evaluation of left elbow bruise. He states that he struck his elbow on pavement on 07/26/2023 and questions if this issue is related. Patient reports that he noticed his elbow was bruised on 08/01/2023. He reports that the swelling is bothersome as he is unable to rest his elbow on surfaces. Denies fever, chills, limited range of motion, or pain.   ROS See HPI     Objective:    BP (!) 140/80 (BP Location: Left Arm, Patient Position: Sitting)   Pulse 67   Temp 97.9 F (36.6 C) (Temporal)   Ht 6' 0.75 (1.848 m)   Wt 223 lb 3.2 oz (101.2 kg)   SpO2 95%   BMI 29.65 kg/m    Physical Exam Constitutional:      Appearance: Normal appearance.  HENT:     Head: Normocephalic and atraumatic.  Cardiovascular:     Rate and Rhythm: Normal rate and regular rhythm.     Pulses: Normal pulses.     Heart sounds: Normal heart sounds.  Pulmonary:     Effort: Pulmonary effort is normal.     Breath sounds: Normal breath sounds.  Musculoskeletal:     Left elbow: Effusion present. No deformity. Normal range of motion. No tenderness.  Skin:    General: Skin is warm and dry.     Capillary Refill: Capillary refill takes less than 2 seconds.  Neurological:     Mental Status: He is alert and oriented to person, place, and time.  Psychiatric:        Behavior: Behavior normal.    Results for orders placed or performed in visit on 08/07/23  Synovial Fluid Analysis, Complete  Result Value Ref Range   Site NOT GIVEN    Color, Synovial RED (A) STRAW/YELLOW   Appearance-Synovial BLOODY,CLOUDY (A) CLEAR/HAZY   WBC, Synovial 757 (H) <150 cells/uL   Neutrophil, Synovial 41 (H) 0 - 24 %   Lymphocytes-Synovial Fld 53 0 - 74 %   Monocyte/Macrophage 6 0 - 69 %   Eosinophils-Synovial 0 0 - 2 %    Basophils, % 0 0 %   Synoviocytes, % 0 0 - 15 %   Crystals, Fluid  NONE SEEN /HPF   Joint Injection/Arthrocentesis  Date/Time: 08/07/2023 1:13 PM  Performed by: Alvia Corean CROME, FNP Authorized by: Alvia Corean CROME, FNP  Indications: joint swelling  Body area: elbow Local anesthesia used: no  Anesthesia: Local anesthesia used: no  Sedation: Patient sedated: no  Needle size: 22 G Ultrasound guidance: no Approach: posterior Aspirate: blood-tinged Aspirate amount: 20 mL Patient tolerance: patient tolerated the procedure well with no immediate complications       Assessment & Plan:   Problem List Items Addressed This Visit   None Visit Diagnoses       Elbow swelling, left    -  Primary   Relevant Orders   Synovial Fluid Analysis, Complete (Completed)   Joint Injection/Arthrocentesis     Compression bandage for comfort.   No orders of the defined types were placed in this encounter.   Return if symptoms worsen or fail to improve.  Debby CHRISTELLA Borer, RN Corean CROME Alvia, FNP

## 2023-08-08 LAB — SYNOVIAL FLUID ANALYSIS, COMPLETE
Basophils, %: 0 %
Eosinophils-Synovial: 0 % (ref 0–2)
Lymphocytes-Synovial Fld: 53 % (ref 0–74)
Monocyte/Macrophage: 6 % (ref 0–69)
Neutrophil, Synovial: 41 % — ABNORMAL HIGH (ref 0–24)
Synoviocytes, %: 0 % (ref 0–15)
WBC, Synovial: 757 {cells}/uL — ABNORMAL HIGH (ref <150)

## 2023-08-09 ENCOUNTER — Encounter: Payer: Self-pay | Admitting: Family Medicine

## 2023-08-10 NOTE — Patient Instructions (Addendum)
 Follow-up with me for new or worsening symptoms.  We have drained the fluid from your elbow today. We will send the fluid for analysis to the lab and will be in contact with any results that require further attention.  Recommend compression to the area so that the fluid does not return.  Follow-up with me for new or worsening symptoms.

## 2023-08-19 ENCOUNTER — Encounter: Payer: Self-pay | Admitting: Internal Medicine

## 2023-08-19 NOTE — Patient Instructions (Addendum)
    Keep using the compression on your left elbow.    If this does not resolve let me know and it can be drained.

## 2023-08-19 NOTE — Progress Notes (Signed)
 Subjective:    Patient ID: Caleb Jones [facility], male    DOB: [birth year], 73 y.o.   MRN: [id number]      HPI Peggy is here for  Chief Complaint  Patient presents with   Bruise    Bruise on left elbow (pain and swelling)    Bruise on elbow-he was initially seen 8/1 by Corean.  At that time he stated that he had struck his elbow on pavement 7/20.  The trauma was very mild-he states it was more of a controlled that than a true fall.  He noticed that the elbow was bruised on 7/26.  The elbow was also swollen and he was unable to rest it on surfaces.  He had no chills or fevers.  Joint injection was done and synovial fluid analysis was complete.  I will placed him bandage for compression.  Stopped compresion two days ago because he wore it overnight and his left hand became very swollen.  Using ice.   The swelling has improved, but he still has swelling there.  He is more concerned about how it looks.  He denies any pain.  He has full range of motion of his joint.  He was wondering if it could be drained.   Medications and allergies reviewed with patient and updated if appropriate.  Current Outpatient Medications on File Prior to Visit  Medication Sig Dispense Refill   acetaminophen (TYLENOL) 500 MG tablet Take 500 mg by mouth at bedtime as needed.     Cholecalciferol (VITAMIN D-1000 MAX ST) 25 MCG (1000 UT) tablet Take by mouth.     clonazePAM  (KLONOPIN ) 0.5 MG tablet TAKE 1 TABLET BY MOUTH TWICE A DAY AS NEEDED FOR ANXIETY 60 tablet 5   levothyroxine  (LEVOXYL ) 50 MCG tablet Take 1 tablet (50 mcg total) by mouth daily before breakfast. 90 tablet 1   Multiple Vitamins-Minerals (MULTIVITAMIN ADULT PO) Take by mouth 1 day or 1 dose.     omeprazole  (PRILOSEC) 20 MG capsule TAKE 1 CAPSULE BY MOUTH EVERY DAY; office visit for further refills 90 capsule 0   pravastatin (PRAVACHOL) 20 MG tablet Take 20 mg by mouth daily.     sertraline  (ZOLOFT ) 100 MG tablet TAKE 1 TABLET BY MOUTH EVERY  DAY. 90 tablet 1   No current facility-administered medications on file prior to visit.    Review of Systems     Objective:   Vitals:   08/20/23 1027  BP: 128/70  Pulse: 65  Temp: 98.3 F (36.8 C)  SpO2: 95%   BP Readings from Last 3 Encounters:  08/20/23 128/70  08/07/23 (!) 140/80  04/03/23 120/86   Wt Readings from Last 3 Encounters:  08/20/23 222 lb (100.7 kg)  08/07/23 223 lb 3.2 oz (101.2 kg)  04/03/23 220 lb (99.8 kg)   Body mass index is 29.49 kg/m.    Physical Exam Constitutional:      General: He is not in acute distress.    Appearance: Normal appearance. He is not ill-appearing.  HENT:     Head: Normocephalic and atraumatic.  Musculoskeletal:     Comments: Left olecranon bursitis that is nontender, not warm not erythematous.  There is slight bruising from his previous fall.  No pain with palpation of the olecranon.  Full range of motion of the elbow without any discomfort.  Skin:    General: Skin is warm and dry.  Neurological:     Mental Status: He is alert.  Assessment & Plan:    See Problem List for Assessment and Plan of chronic medical problems.

## 2023-08-20 ENCOUNTER — Ambulatory Visit: Admitting: Internal Medicine

## 2023-08-20 VITALS — BP 128/70 | HR 65 | Temp 98.3°F | Ht 72.75 in | Wt 222.0 lb

## 2023-08-20 DIAGNOSIS — M7022 Olecranon bursitis, left elbow: Secondary | ICD-10-CM | POA: Diagnosis not present

## 2023-08-20 NOTE — Assessment & Plan Note (Signed)
 Subacute S/p drainage 8/1 with improvement, but some fluid has reaccumulated-definitely better than it was previously He is not happy with how it looks No pain, erythema, warmth Very low suspicion of underlying fracture since there is no tenderness with palpation of the olecranon and no pain with active or passive movement of the elbow Advised continuing conservative treatment with compression and avoiding any pressure on the elbow-would like to avoid the risk of infection with drainage If the swelling does not improve advised him to let me know and we can arrange it to be drained

## 2023-09-26 ENCOUNTER — Other Ambulatory Visit: Payer: Self-pay | Admitting: Internal Medicine

## 2023-10-05 ENCOUNTER — Ambulatory Visit: Admitting: Internal Medicine

## 2023-10-13 DIAGNOSIS — E785 Hyperlipidemia, unspecified: Secondary | ICD-10-CM | POA: Insufficient documentation

## 2023-10-13 NOTE — Progress Notes (Unsigned)
 Subjective:    Patient ID: Caleb Jones Jones [facility], male    DOB: 1950-05-14, 73 y.o.   MRN: [id number]     HPI Caleb Jones is here for follow up of his chronic medical problems.  Taking pravastatin - having stiffness in finger joints.     Having senior fatigue x months.  Will come home from work and doze off at [employer] for an hour.  At work he is focused and drinking caffeine during the day which helps him get through the day.  He sleeps well at night.  He gets about 6 hrs of sleep at night.   No exercise.    Frustrated with eyes - goes to [facility].  Vision in right eye is not good.    Medications and allergies reviewed with patient and updated if appropriate.  Current Outpatient Medications on File Prior to Visit  Medication Sig Dispense Refill   acetaminophen (TYLENOL) 500 MG tablet Take 500 mg by mouth at bedtime as needed.     Cholecalciferol (VITAMIN D-1000 MAX ST) 25 MCG (1000 UT) tablet Take by mouth.     clonazePAM  (KLONOPIN ) 0.5 MG tablet TAKE 1 TABLET BY MOUTH TWICE A DAY AS NEEDED FOR ANXIETY 60 tablet 5   levothyroxine  (LEVOXYL ) 50 MCG tablet Take 1 tablet (50 mcg total) by mouth daily before breakfast. 90 tablet 1   Multiple Vitamins-Minerals (MULTIVITAMIN ADULT PO) Take by mouth 1 day or 1 dose.     omeprazole  (PRILOSEC) 20 MG capsule TAKE 1 CAPSULE BY MOUTH EVERY DAY; office visit for further refills 90 capsule 0   pravastatin (PRAVACHOL) 20 MG tablet Take 20 mg by mouth daily.     sertraline  (ZOLOFT ) 100 MG tablet TAKE 1 TABLET BY MOUTH EVERY DAY 90 tablet 1   No current facility-administered medications on file prior to visit.     Review of Systems  Constitutional:  Negative for fever.  Respiratory:  Negative for cough, shortness of breath and wheezing.   Cardiovascular:  Negative for chest pain, palpitations and leg swelling.  Neurological:  Negative for light-headedness and headaches.       Objective:   Vitals:   10/14/23 1002  BP: 122/70  Pulse: 71   Temp: 98.1 F (36.7 C)  SpO2: 95%   BP Readings from Last 3 Encounters:  10/14/23 122/70  08/20/23 128/70  08/07/23 (!) 140/80   Wt Readings from Last 3 Encounters:  10/14/23 219 lb (99.3 kg)  08/20/23 222 lb (100.7 kg)  08/07/23 223 lb 3.2 oz (101.2 kg)   Body mass index is 29.09 kg/m.    Physical Exam Constitutional:      General: He is not in acute distress.    Appearance: Normal appearance. He is not ill-appearing.  HENT:     Head: Normocephalic and atraumatic.  Eyes:     Conjunctiva/sclera: Conjunctivae normal.  Cardiovascular:     Rate and Rhythm: Normal rate and regular rhythm.     Heart sounds: Normal heart sounds.  Pulmonary:     Effort: Pulmonary effort is normal. No respiratory distress.     Breath sounds: Normal breath sounds. No wheezing or rales.  Musculoskeletal:     Right lower leg: No edema.     Left lower leg: No edema.  Skin:    General: Skin is warm and dry.     Findings: No rash.  Neurological:     Mental Status: He is alert. Mental status is at baseline.  Psychiatric:  Mood and Affect: Mood normal.        Lab Results  Component Value Date   WBC 8.2 08/27/2022   HGB 14.8 08/27/2022   HCT 46.0 08/27/2022   PLT 201.0 08/27/2022   GLUCOSE 75 04/03/2023   CHOL 185 08/27/2022   TRIG 173.0 (H) 08/27/2022   HDL 38.70 (L) 08/27/2022   LDLDIRECT 113.0 12/03/2021   LDLCALC 111 (H) 08/27/2022   ALT 14 04/03/2023   AST 19 04/03/2023   NA 142 04/03/2023   K 4.7 04/03/2023   CL 111 04/03/2023   CREATININE 1.43 04/03/2023   BUN 11 04/03/2023   CO2 25 04/03/2023   TSH 3.16 04/03/2023   PSA 0.01 Repeated and verified X2. (L) 08/05/2010   HGBA1C 5.7 04/03/2023   MICROALBUR 30.4 (H) 04/03/2023     Assessment & Plan:    See Problem List for Assessment and Plan of chronic medical problems.

## 2023-10-14 ENCOUNTER — Ambulatory Visit: Admitting: Internal Medicine

## 2023-10-14 ENCOUNTER — Encounter: Payer: Self-pay | Admitting: Internal Medicine

## 2023-10-14 VITALS — BP 122/70 | HR 71 | Temp 98.1°F | Ht 72.75 in | Wt 219.0 lb

## 2023-10-14 DIAGNOSIS — N1831 Chronic kidney disease, stage 3a: Secondary | ICD-10-CM

## 2023-10-14 DIAGNOSIS — E785 Hyperlipidemia, unspecified: Secondary | ICD-10-CM

## 2023-10-14 DIAGNOSIS — G479 Sleep disorder, unspecified: Secondary | ICD-10-CM

## 2023-10-14 DIAGNOSIS — F419 Anxiety disorder, unspecified: Secondary | ICD-10-CM | POA: Diagnosis not present

## 2023-10-14 DIAGNOSIS — E063 Autoimmune thyroiditis: Secondary | ICD-10-CM

## 2023-10-14 DIAGNOSIS — F329 Major depressive disorder, single episode, unspecified: Secondary | ICD-10-CM

## 2023-10-14 DIAGNOSIS — R7303 Prediabetes: Secondary | ICD-10-CM

## 2023-10-14 NOTE — Assessment & Plan Note (Signed)
Chronic Blood pressure well-controlled Avoiding NSAIDs Following with nephrology CBC, CMP

## 2023-10-14 NOTE — Assessment & Plan Note (Signed)
 Chronic Lab Results  Component Value Date   HGBA1C 5.7 04/03/2023   Check a1c Low sugar / carb diet Stressed regular exercise

## 2023-10-14 NOTE — Patient Instructions (Addendum)
      Blood work was ordered.       Medications changes include :   None      Return in about 6 months (around 04/13/2024) for Physical Exam.

## 2023-10-14 NOTE — Assessment & Plan Note (Addendum)
 Chronic Regular exercise and healthy diet encouraged Check lipid panel  Continue pravastatin 20 mg daily  EKG NSR at 66 bpm, low voltage QRS.  Compared to EKG from 2017 sinus bradycardia is no longer present, but otherwise no change.

## 2023-10-14 NOTE — Assessment & Plan Note (Signed)
 Chronic  Clinically euthyroid Check tsh and will titrate med dose if needed Continue levothyroxine  50 mcg daily

## 2023-10-14 NOTE — Assessment & Plan Note (Signed)
Chronic Controlled, Stable Continue clonazepam 0.5 mg at bedtime for stress and to help with sleep

## 2023-10-14 NOTE — Assessment & Plan Note (Signed)
 Chronic Controlled, Stable Continue sertraline 100 mg daily

## 2023-10-14 NOTE — Assessment & Plan Note (Signed)
 Chronic Fairly controlled Continue sertraline  100 mg daily, clonazepam  0.5 mg in the morning, 0.5 mg in the evening

## 2023-10-16 ENCOUNTER — Other Ambulatory Visit (HOSPITAL_BASED_OUTPATIENT_CLINIC_OR_DEPARTMENT_OTHER): Payer: Self-pay

## 2023-10-16 MED ORDER — COMIRNATY 30 MCG/0.3ML IM SUSY
0.3000 mL | PREFILLED_SYRINGE | Freq: Once | INTRAMUSCULAR | 0 refills | Status: AC
  Filled 2023-10-16: qty 0.3, 1d supply, fill #0

## 2023-10-18 ENCOUNTER — Other Ambulatory Visit (HOSPITAL_BASED_OUTPATIENT_CLINIC_OR_DEPARTMENT_OTHER): Payer: Self-pay

## 2023-11-30 ENCOUNTER — Other Ambulatory Visit: Payer: Self-pay | Admitting: Internal Medicine

## 2023-12-27 ENCOUNTER — Other Ambulatory Visit: Payer: Self-pay | Admitting: Internal Medicine

## 2024-02-08 ENCOUNTER — Other Ambulatory Visit: Payer: Self-pay | Admitting: Internal Medicine

## 2024-02-10 ENCOUNTER — Encounter: Payer: Self-pay | Admitting: Internal Medicine
